# Patient Record
Sex: Female | Born: 1960 | Race: White | Hispanic: No | Marital: Single
Health system: Midwestern US, Community
[De-identification: ages and names within clinical notes are randomized; demographics above are authoritative.]

## PROBLEM LIST (undated history)

## (undated) DIAGNOSIS — R911 Solitary pulmonary nodule: Secondary | ICD-10-CM

## (undated) DIAGNOSIS — M1990 Unspecified osteoarthritis, unspecified site: Secondary | ICD-10-CM

## (undated) DIAGNOSIS — M419 Scoliosis, unspecified: Secondary | ICD-10-CM

## (undated) DIAGNOSIS — M75100 Unspecified rotator cuff tear or rupture of unspecified shoulder, not specified as traumatic: Secondary | ICD-10-CM

## (undated) DIAGNOSIS — G4700 Insomnia, unspecified: Secondary | ICD-10-CM

## (undated) DIAGNOSIS — J189 Pneumonia, unspecified organism: Secondary | ICD-10-CM

## (undated) DIAGNOSIS — IMO0002 Reserved for concepts with insufficient information to code with codable children: Secondary | ICD-10-CM

## (undated) DIAGNOSIS — L930 Discoid lupus erythematosus: Secondary | ICD-10-CM

## (undated) DIAGNOSIS — I8390 Asymptomatic varicose veins of unspecified lower extremity: Secondary | ICD-10-CM

## (undated) DIAGNOSIS — J45909 Unspecified asthma, uncomplicated: Secondary | ICD-10-CM

## (undated) DIAGNOSIS — IMO0001 Reserved for inherently not codable concepts without codable children: Secondary | ICD-10-CM

## (undated) DIAGNOSIS — M329 Systemic lupus erythematosus, unspecified: Secondary | ICD-10-CM

## (undated) DIAGNOSIS — M461 Sacroiliitis, not elsewhere classified: Secondary | ICD-10-CM

## (undated) DIAGNOSIS — F32A Depression, unspecified: Secondary | ICD-10-CM

## (undated) DIAGNOSIS — J9819 Other pulmonary collapse: Secondary | ICD-10-CM

## (undated) DIAGNOSIS — E538 Deficiency of other specified B group vitamins: Secondary | ICD-10-CM

## (undated) DIAGNOSIS — Z9289 Personal history of other medical treatment: Secondary | ICD-10-CM

## (undated) HISTORY — DX: Unspecified asthma, uncomplicated: J45.909

## (undated) HISTORY — DX: Reserved for concepts with insufficient information to code with codable children: IMO0002

## (undated) HISTORY — DX: Sacroiliitis, not elsewhere classified: M46.1

## (undated) HISTORY — DX: Depression, unspecified: F32.A

## (undated) HISTORY — DX: Insomnia, unspecified: G47.00

## (undated) HISTORY — DX: Systemic lupus erythematosus, unspecified: M32.9

## (undated) HISTORY — DX: Asymptomatic varicose veins of unspecified lower extremity: I83.90

## (undated) HISTORY — PX: CARDIAC CATHETERIZATION: SHX172

## (undated) HISTORY — DX: Deficiency of other specified B group vitamins: E53.8

## (undated) HISTORY — DX: Other pulmonary collapse: J98.19

## (undated) HISTORY — PX: ROTATOR CUFF REPAIR: SHX139

---
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
## 2009-12-25 LAB — ALLERGENS, SOUTHEAST COAST INHALANT (22)
Alternaria tenuis: <0.1 kU/L (ref <=0.34)
American Beech: <0.1 kU/L (ref <=0.34)
Aspergillus fumigatus: <0.1 kU/L (ref <=0.34)
Bermuda grass: <0.1 kU/L (ref <=0.34)
Cat epith./dander: <0.1 kU/L (ref <=0.34)
Common/Short ragweed: <0.1 kU/L (ref <=0.34)
Cottonwood: <0.1 kU/L (ref <=0.34)
D. farinae: <0.1 kU/L (ref <=0.34)
Dog dander: <0.1 kU/L (ref <=0.34)
English plantain: <0.1 kU/L (ref <=0.34)
German cockroach: <0.1 kU/L (ref <=0.34)
Giant ragweed: <0.1 kU/L (ref <=0.34)
Helminthosporium: <0.1 kU/L (ref <=0.34)
Hormodendrum: <0.1 kU/L (ref <=0.34)
House dust Stier: <0.1 kU/L (ref <=0.34)
Ky. Blue/June grass: <0.1 kU/L (ref <=0.34)
Lamb's Quarters: <0.1 kU/L (ref <=0.34)
Maple/Box Elder: <0.1 kU/L (ref <=0.34)
Oak: <0.1 kU/L (ref <=0.34)
Penicillium notatum: <0.1 kU/L (ref <=0.34)
Pigweed: <0.1 kU/L (ref <=0.34)
Timothy grass: <0.1 kU/L (ref <=0.34)

## 2009-12-25 LAB — IMMUNOGLOBULIN E, QT: Immunoglobulin E (IgE): 4 IU/mL (ref 0–180)

## 2010-02-04 LAB — HEMOGLOBIN: HGB: 14.8 g/dL (ref 11.7–15.4)

## 2012-03-30 ENCOUNTER — Encounter
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
## 2012-06-26 NOTE — Telephone Encounter (Signed)
Pt states that she has some shortness of breath and wheezing. She did a breathing tx this am, and did not improved much. Pt has a cough with some brown mucous. She denies fever, chills, does has body aches. Pt has some sick contacts. Pt requests to see Franky Macho, appt made 10:30 tomorrow with Franky Macho
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
## 2012-07-16 LAB — AMB POC SPIROMETRY
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
## 2012-07-31 NOTE — Telephone Encounter (Signed)
Patient called and left message on RX line requesting xopenex refill.Will E-Scribe.Landis Martins

## 2012-08-08 NOTE — Progress Notes (Signed)
Pt called and stated that she needed a PA for Xopenex HFA.  I have contacted [facility] in [place] (pt just moved there) [phone].  They stated that the PA had already been done and that it went through her [facility].  Her cost is $63.45.  I have attempted to inform the patient of this, but her voicemail is full and I cannot leave her a message.

## 2013-01-06 NOTE — ED Notes (Signed)
Pt here with nasal congestion and pain to bilateral ribs this morning. States history of bronchitis and collapsed lungs

## 2013-01-06 NOTE — ED Notes (Signed)
I have reviewed discharge instructions with the patient.  The patient verbalized understanding.      Pt given RX for following medications at discharge @MEDDISCHARGE@.  Pt discharged ambulatory from emergency department in no acute distress.
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
## 2013-01-07 NOTE — Telephone Encounter (Signed)
C/o semi-productive cough, some hemoptysis; chief concern is pain in lower ribs, went to ED during weekend b/c of hx of collapsed lung; cleared for effusions and collapsed lung and was advised to f/u here in office; continues w/ cough and rib pain, ED provider suggests pleurisy; appt made today w/ known provider NP Luc

## 2013-03-14 NOTE — Telephone Encounter (Signed)
Patient called and left message on voicemail stating that her insurance will not cover her Advair. She uses [facility] [phone]. I have called [facility] and spoke with pharmacy tech, she ran the patient's Advair and her copay is $3.60. I have called the patient and left her a message with this information.

## 2013-05-23 MED ORDER — LEVALBUTEROL HFA 45 MCG/ACTUATION AEROSOL INHALER
45 mcg/actuation | RESPIRATORY_TRACT | Status: AC
Start: 2013-05-23 — End: ?

## 2013-05-24 MED ORDER — BUDESONIDE-FORMOTEROL HFA 160 MCG-4.5 MCG/ACTUATION AEROSOL INHALER
Freq: Two times a day (BID) | RESPIRATORY_TRACT | Status: DC
Start: 2013-05-24 — End: 2013-06-06

## 2013-05-24 NOTE — Telephone Encounter (Signed)
Begin symbicort 160/4.5, 2 puffs bid, rinse mouth after use, # 1, refill x 11.  Please notify her of reason for change.  Thanks, Liberty MutualLuke

## 2013-05-24 NOTE — Telephone Encounter (Signed)
Received a denial for Advair from [employer] [facility], they are requiring that there patient have a trial and failure of Symbicort or Dulera first. Please advise

## 2013-05-24 NOTE — Telephone Encounter (Signed)
[facility] [place] Patient has been informed via message on voicemail.

## 2013-06-06 MED ORDER — MOMETASONE-FORMOTEROL HFA 200 MCG-5 MCG/ACTUATION AEROSOL INHALER
200-5 mcg/actuation | Freq: Two times a day (BID) | RESPIRATORY_TRACT | Status: DC
Start: 2013-06-06 — End: 2013-08-05

## 2013-06-06 NOTE — Telephone Encounter (Signed)
Dulera e-scribed to the patient's pharmacy.

## 2013-06-06 NOTE — Telephone Encounter (Signed)
Please stop symbicort and begin dulera 200/5, 2 puffs twice daily, rinse mouth after use, # 1, refill x 11.

## 2013-06-06 NOTE — Telephone Encounter (Signed)
The patient states she had been on Advair for a couple of years but her insurance wanted her to switch to Symbicort which she did a couple of weeks ago.  She states that Symbicort is not working nearly as well for her as the Advair did.  She says that Trula OreChristina had told her that if Symbicort didn't work she would need to try [place] for insurance purposes.      Franky MachoLuke, please advise if you would like to [facility]? Thank you

## 2013-06-06 NOTE — Telephone Encounter (Signed)
Tried to return the patient's call , could not reach her so left her a voicemail to return our call.

## 2013-07-24 MED ORDER — MONTELUKAST 10 MG TAB
10 mg | ORAL_TABLET | ORAL | Status: DC
Start: 2013-07-24 — End: 2013-07-24

## 2013-07-24 MED ORDER — MONTELUKAST 10 MG TAB
10 mg | ORAL_TABLET | ORAL | Status: AC
Start: 2013-07-24 — End: ?

## 2013-07-30 NOTE — Telephone Encounter (Signed)
The patient called and says that she woke up yesterday not feeling well and is blowing brown nasal drainage out of her nose, has mouth dryness, and is coughing a good bit.  She denies a fever, increased shortness of breath or wheezing.   She states that she used to do so well on Advair but says her insurance would not cover it so she had to switch to [facility] which she feels like does not work as well.  The patient states that  She does not know if this is allergy or lung related.  She reports taking Singulair every night and Dulera twice per day.  She says she hasn't had to use her Xopenex recently.   Offered her a work in appointment and she says she really doesn't want to come in unless we feel she needs to.  Told her it appears she was due for her 6 month return appointment anyway this month.  She accepted a work in appointment for tomorrow told 1:50 with Franky Macho.

## 2013-08-05 LAB — AMB POC SPIROMETRY

## 2013-08-05 MED ORDER — ALBUTEROL SULFATE 1.25 MG/3 ML (0.042 %) SOLN FOR INHALATION
1.25 mg/3 mL | RESPIRATORY_TRACT | Status: AC
Start: 2013-08-05 — End: ?

## 2013-08-05 MED ORDER — FLUTICASONE-SALMETEROL 500 MCG-50 MCG/DOSE DISK DEVICE FOR INHALATION
500-50 mcg/dose | RESPIRATORY_TRACT | Status: DC
Start: 2013-08-05 — End: 2013-09-04
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
## 2013-08-22 NOTE — Telephone Encounter (Signed)
Today is the 1st time anyone has mentioned an authorization for this patient. I have completed the form and the [facility] Adv[facility]. Waiting on a response.

## 2013-08-23 NOTE — Telephone Encounter (Signed)
Received fax from [facility], Garlon Hatchetdvair has been approved from 08/22/13-08/23/14.

## 2013-09-02 NOTE — Telephone Encounter (Signed)
Patient called the office, she said that she feels like the Advair 500-50 is too strong for her, she feels funny in her head. She would like to go back to the Advair 250-50. Please advise.

## 2013-09-03 NOTE — Telephone Encounter (Signed)
Unsure that this is related to advair, but ok to change to 250/50, 1 inhalation bid, rinse mouth after use.

## 2013-09-04 MED ORDER — FLUTICASONE-SALMETEROL 250 MCG-50 MCG/DOSE DISK DEVICE FOR INHALATION
250-50 mcg/dose | Freq: Two times a day (BID) | RESPIRATORY_TRACT | Status: DC
Start: 2013-09-04 — End: 2014-08-19

## 2013-09-04 NOTE — Telephone Encounter (Signed)
[facility]

## 2013-09-11 MED ORDER — PREDNISONE 20 MG TAB
20 mg | ORAL_TABLET | Freq: Every day | ORAL | Status: AC
Start: 2013-09-11 — End: 2013-09-16

## 2013-09-11 MED ORDER — DIPHENHYDRAMINE HCL 50 MG/ML IJ SOLN
50 mg/mL | INTRAMUSCULAR | Status: AC
Start: 2013-09-11 — End: 2013-09-11
  Administered 2013-09-11: 20:00:00 via INTRAVENOUS

## 2013-09-11 MED ORDER — METHYLPREDNISOLONE (PF) 125 MG/2 ML IJ SOLR
125 mg/2 mL | Freq: Once | INTRAMUSCULAR | Status: AC
Start: 2013-09-11 — End: 2013-09-11
  Administered 2013-09-11: 21:00:00 via INTRAVENOUS

## 2013-09-11 MED FILL — SOLU-MEDROL (PF) 125 MG/2 ML SOLUTION FOR INJECTION: 125 mg/2 mL | INTRAMUSCULAR | Qty: 2

## 2013-09-11 MED FILL — DIPHENHYDRAMINE HCL 50 MG/ML IJ SOLN: 50 mg/mL | INTRAMUSCULAR | Qty: 1

## 2013-09-11 NOTE — ED Notes (Signed)
I have reviewed discharge instructions with the patient.  The patient verbalized understanding. Prescriptions given times one.
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
## 2013-09-11 NOTE — ED Notes (Signed)
Pt states she was stung on left pointer finger approx 30 minutes PTA.  Pt took epi autoinjector to left thigh.

## 2013-09-12 LAB — EKG, 12 LEAD, INITIAL
Atrial Rate: 98 {beats}/min
Calculated P Axis: 43 degrees
Calculated R Axis: 13 degrees
Calculated T Axis: 72 degrees
P-R Interval: 128 ms
Q-T Interval: 360 ms
QRS Duration: 86 ms
QTC Calculation (Bezet): 459 ms
Ventricular Rate: 98 {beats}/min

## 2013-11-20 ENCOUNTER — Emergency Department (HOSPITAL_COMMUNITY)
Admission: EM | Admit: 2013-11-20 | Discharge: 2013-11-20 | Disposition: A | Discharge status: Home / Self Care | Payer: Medicare Other | Attending: Emergency Medicine | Admitting: Emergency Medicine

## 2013-11-20 ENCOUNTER — Encounter (HOSPITAL_COMMUNITY): Payer: Self-pay | Admitting: Emergency Medicine

## 2013-11-20 DIAGNOSIS — L930 Discoid lupus erythematosus: Secondary | ICD-10-CM | POA: Diagnosis present

## 2013-11-20 HISTORY — DX: Discoid lupus erythematosus: L93.0

## 2013-11-20 MED ORDER — TRIAMCINOLONE ACETONIDE 40 MG/ML IJ SUSP
5.0000 mg | Freq: Once | INTRAMUSCULAR | Status: DC
  Filled 2013-11-20: qty 0.5

## 2013-11-20 NOTE — Discharge Instructions (Signed)
Lupus Lupus (also called systemic lupus erythematosus, SLE) is a disorder of the body's natural defense system (immune system). In lupus, the immune system attacks various areas of the body (autoimmune disease). CAUSES The cause is unknown. However, lupus runs in families. Certain genes can make you more likely to develop lupus. It is 10 times more common in women than in men. Lupus is also more common in African Americans and Asians. Other factors also play a role, such as viruses (Epstein-Barr virus, EBV), stress, hormones, cigarette smoke, and certain drugs. SYMPTOMS Lupus can affect many parts of the body, including the joints, skin, kidneys, lungs, heart, nervous system, and blood vessels. The signs and symptoms of lupus differ from person to person. The disease can range from mild to life-threatening. Typical features of lupus include:  Butterfly-shaped rash over the face.  Arthritis involving one or more joints.  Kidney disease.  Fever, weight loss, hair loss, fatigue.  Poor circulation in the fingers and toes (Raynaud's disease).  Chest pain when taking deep breaths. Abdominal pain may also occur.  Skin rash in areas exposed to the sun.  Sores in the mouth and nose. DIAGNOSIS Diagnosing lupus can take a long time and is often difficult. An exam and an accurate account of your symptoms and health problems is very important. Blood tests are necessary, though no single test can confirm or rule out lupus. Most people with lupus test positive for antinuclear antibodies (ANA) on a blood test. Additional blood tests, a urine test (urinalysis), and sometimes a kidney or skin tissue sample (biopsy) can help to confirm or rule out lupus. TREATMENT There is no cure for lupus. Your caregiver will develop a treatment plan based on your age, sex, health, symptoms, and lifestyle. The goals are to prevent flares, to treat them when they do occur, and to minimize organ damage and complications. How  the disease may affect each person varies widely. Most people with lupus can live normal lives, but this disorder must be carefully monitored. Treatment must be adjusted as necessary to prevent serious complications. Medicines used for treatment:  Nonsteroidal anti-inflammatory drugs (NSAIDs) decrease inflammation and can help with chest pain, joint pain, and fevers. Examples include ibuprofen and naproxen.  Antimalarial drugs were designed to treat malaria. They also treat fatigue, joint pain, skin rashes, and inflammation of the lungs in patients with lupus.  Corticosteroids are powerful hormones that rapidly suppress inflammation. The lowest dose with the highest benefit will be chosen. They can be given by cream, pills, injections, and through the vein (intravenously).  Immunosuppressive drugs block the making of immune cells. They may be used for kidney or nerve disease. HOME CARE INSTRUCTIONS  Exercise. Low-impact activities can usually help keep joints flexible without being too strenuous.  Rest after periods of exercise.  Avoid excessive sun exposure.  Follow proper nutrition and take supplements as recommended by your caregiver.  Stress management can be helpful. SEEK MEDICAL CARE IF:  You have increased fatigue.  You develop pain.  You develop a rash.  You have an oral temperature above 102 F (38.9 C).  You develop abdominal discomfort.  You develop a headache.  You experience dizziness. FOR MORE INFORMATION National Institute of Neurological Disorders and Stroke: www.ninds.nih.gov American College of Rheumatology: www.rheumatology.org National Institute of Arthritis and Musculoskeletal and Skin Diseases: www.niams.nih.gov Document Released: 02/11/2002 Document Revised: 05/16/2011 Document Reviewed: 06/04/2009 ExitCare Patient Information 2015 [employer]. This information is not intended to replace advice given to you by your health   care provider. Make sure  you discuss any questions you have with your health care provider.  

## 2013-11-20 NOTE — ED Provider Notes (Signed)
CSN: [id number]     Arrival date & time 11/20/13  1252 History   First MD Initiated Contact with Patient 11/20/13 1319     Chief Complaint  Patient presents with  . Lupus     (Consider location/radiation/quality/duration/timing/severity/associated sxs/prior Treatment) HPI Comments: Patient presents to the ER for evaluation and treatment of discoid lupus. 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 Patient recently moved to the area, does not have a dermatologist. She reports that she has been on prednisone daily in the past which has prevented outbreaks, but she is currently taking it. She reports that when she gets to this point, and generally she needs an injection of Kenalog into the lesions to resolve. She reports multiple circular scab-like lesions on the scalp similar to previous outbreaks.   Past Medical History  Diagnosis Date  . Discoid lupus    Past Surgical History  Procedure Laterality Date  . Back surgery    . Spinal fusion     History reviewed. No pertinent family history. History  Substance Use Topics  . Smoking status: Never Smoker   . Smokeless tobacco: Not on file  . Alcohol Use: No   OB History   Grav Para Term Preterm Abortions TAB SAB Ect Mult Living                 Review of Systems  Skin: Positive for wound.  All other systems reviewed and are negative.     Allergies  Review of patient's allergies indicates no known allergies.  Home Medications   Prior to Admission medications   Not on File   BP 129/80  Pulse 78  Temp(Src) 98.1 F (36.7 C) (Oral)  Resp 18  SpO2 100%  LMP 11/06/2013 Physical Exam  Constitutional: She is oriented to person, place, and time. She appears well-developed and well-nourished. No distress.  HENT:  Head: Normocephalic and atraumatic.  Right Ear: Hearing normal.  Left Ear: Hearing normal.  Nose: Nose normal.  Mouth/Throat: Oropharynx is clear and moist and mucous  membranes are normal.  Eyes: Conjunctivae and EOM are normal. Pupils are equal, round, and reactive to light.  Neck: Normal range of motion. Neck supple.  Cardiovascular: Regular rhythm, S1 normal and S2 normal.  Exam reveals no gallop and no friction rub.   No murmur heard. Pulmonary/Chest: Effort normal and breath sounds normal. No respiratory distress. She exhibits no tenderness.  Abdominal: Soft. Normal appearance and bowel sounds are normal. There is no hepatosplenomegaly. There is no tenderness. There is no rebound, no guarding, no tenderness at McBurney's point and negative Murphy's sign. No hernia.  Musculoskeletal: Normal range of motion.  Neurological: She is alert and oriented to person, place, and time. She has normal strength. No cranial nerve deficit or sensory deficit. Coordination normal. GCS eye subscore is 4. GCS verbal subscore is 5. GCS motor subscore is 6.  Skin: Skin is warm, dry and intact. No rash noted. No cyanosis.     Psychiatric: She has a normal mood and affect. Her speech is normal and behavior is normal. Thought content normal.    ED Course  Procedures (including critical care time)  Intralesional injection with Kenalog: Area was cleaned with alcohol. A 27-gauge needle was inserted into the center of the lesion into the mid-dermis and 2 mg of Kenalog was injected. This was repeated one time for a total of 2 lesions. Patient tolerated procedure well, no complications.  Labs Review Labs  Reviewed - No data to display  Imaging Review No results found.   EKG Interpretation None      MDM   Final diagnoses:  None   discoid lupus  Patient presents to the ER for evaluation of discoid lupus outbreak. Patient has 2 discrete but small lesions on her posterior vertex area of scalp. This is the area where she generally has her outbreaks. Patient does not currently have a dermatologist, just recently moved to the area from [place]. She reports that  intralesional injections of Kenalog have been successful in the past. Patient was injected with Kenalog 2 mg intradermal injection in each of the lesions. Patient tolerated procedure well without complications.    Gilda Crease, MD 11/20/13 [phone]

## 2013-11-20 NOTE — ED Notes (Signed)
Pt just moved here and is not established w/ a PCP.  Pt states that she has an outbreak of discoid lupus on her scalp.  Requesting a scalp injection of kenalog.

## 2013-12-11 ENCOUNTER — Encounter: Attending: Acute Care | Primary: Student in an Organized Health Care Education/Training Program

## 2014-01-05 DIAGNOSIS — J189 Pneumonia, unspecified organism: Secondary | ICD-10-CM

## 2014-01-05 HISTORY — DX: Pneumonia, unspecified organism: J18.9

## 2014-03-05 ENCOUNTER — Emergency Department (HOSPITAL_COMMUNITY): Payer: Medicare Other

## 2014-03-05 ENCOUNTER — Emergency Department (HOSPITAL_COMMUNITY)
Admission: EM | Admit: 2014-03-05 | Discharge: 2014-03-05 | Disposition: A | Discharge status: Home / Self Care | Payer: Medicare Other | Attending: Emergency Medicine | Admitting: Emergency Medicine

## 2014-03-05 ENCOUNTER — Encounter (HOSPITAL_COMMUNITY): Payer: Self-pay | Admitting: Emergency Medicine

## 2014-03-05 DIAGNOSIS — Z79899 Other long term (current) drug therapy: Secondary | ICD-10-CM | POA: Diagnosis not present

## 2014-03-05 DIAGNOSIS — Z791 Long term (current) use of non-steroidal anti-inflammatories (NSAID): Secondary | ICD-10-CM | POA: Diagnosis not present

## 2014-03-05 DIAGNOSIS — Z8739 Personal history of other diseases of the musculoskeletal system and connective tissue: Secondary | ICD-10-CM | POA: Diagnosis not present

## 2014-03-05 DIAGNOSIS — R059 Cough, unspecified: Secondary | ICD-10-CM

## 2014-03-05 DIAGNOSIS — Z7951 Long term (current) use of inhaled steroids: Secondary | ICD-10-CM | POA: Diagnosis not present

## 2014-03-05 DIAGNOSIS — R5381 Other malaise: Secondary | ICD-10-CM | POA: Diagnosis not present

## 2014-03-05 DIAGNOSIS — Z8701 Personal history of pneumonia (recurrent): Secondary | ICD-10-CM | POA: Diagnosis not present

## 2014-03-05 DIAGNOSIS — Z872 Personal history of diseases of the skin and subcutaneous tissue: Secondary | ICD-10-CM | POA: Diagnosis not present

## 2014-03-05 DIAGNOSIS — R05 Cough: Secondary | ICD-10-CM

## 2014-03-05 HISTORY — DX: Pneumonia, unspecified organism: J18.9

## 2014-03-05 HISTORY — DX: Scoliosis, unspecified: M41.9

## 2014-03-05 NOTE — ED Provider Notes (Signed)
CSN: [id number]     Arrival date & time 03/05/14  [id number] History   First MD Initiated Contact with Patient 03/05/14 1004     Chief Complaint  Patient presents with  . Pneumonia      HPI Patient presents to the emergency department complaining of generalized malaise since recent illness when she was traveling out of state.  She states that time she was diagnosed with pneumonia and was placed on Levaquin.  She states she continues to not have her normal energy.  She denies melena or hematochezia.  No fevers or chills.  Appetite has been normal for her.  Denies abdominal pain.  She reports ongoing right sided chest discomfort which is been present for several years.  She states that is unchanged.  She always has productive cough reports no change in her phlegm production.  She is developing a relationship with a primary care physician reports that she previously always had a pulmonologist and would like referral to a pulmonologist.  X-ray which was brought with her from [place] reports that she had some scarring right changes noted in the right lower lung on x-ray.  She is curious about these.  She denies shortness of breath.   Past Medical History  Diagnosis Date  . Discoid lupus   . Pneumonia   . Scoliosis    Past Surgical History  Procedure Laterality Date  . Back surgery    . Spinal fusion     History reviewed. No pertinent family history. History  Substance Use Topics  . Smoking status: Never Smoker   . Smokeless tobacco: Not on file  . Alcohol Use: No   OB History    No data available     Review of Systems  All other systems reviewed and are negative.     Allergies  Review of patient's allergies indicates no known allergies.  Home Medications   Prior to Admission medications   Medication Sig Start Date End Date Taking? Authorizing Provider  albuterol (PROVENTIL) (2.5 MG/3ML) 0.083% nebulizer solution Take 2.5 mg by nebulization every 6 (six) hours as needed for wheezing  or shortness of breath.   Yes Historical Provider, MD  amitriptyline (ELAVIL) 10 MG tablet Take 20 mg by mouth at bedtime.   Yes Historical Provider, MD  Cyanocobalamin (VITAMIN B-12 IJ) Inject as directed every 30 (thirty) days.   Yes Historical Provider, MD  Fluticasone-Salmeterol (ADVAIR) 250-50 MCG/DOSE AEPB Inhale 1 puff into the lungs 2 (two) times daily.   Yes Historical Provider, MD  ipratropium (ATROVENT HFA) 17 MCG/ACT inhaler Inhale 2 puffs into the lungs every 4 (four) hours as needed for wheezing.   Yes Historical Provider, MD  meloxicam (MOBIC) 7.5 MG tablet Take 7.5 mg by mouth daily.   Yes Historical Provider, MD  montelukast (SINGULAIR) 10 MG tablet Take 10 mg by mouth at bedtime.   Yes Historical Provider, MD   BP 134/77 mmHg  Pulse 86  Temp(Src) 98.4 F (36.9 C) (Oral)  Resp 18  SpO2 99% Physical Exam  Constitutional: She is oriented to person, place, and time. She appears well-developed and well-nourished. No distress.  HENT:  Head: Normocephalic and atraumatic.  Eyes: Conjunctivae and EOM are normal.  Neck: Normal range of motion.  Cardiovascular: Normal rate, regular rhythm and normal heart sounds.   Pulmonary/Chest: Effort normal and breath sounds normal.  Abdominal: Soft. She exhibits no distension. There is no tenderness.  Musculoskeletal: Normal range of motion.  Neurological: She is alert and oriented to  person, place, and time.  Skin: Skin is warm and dry.  Psychiatric: She has a normal mood and affect. Judgment normal.  Nursing note and vitals reviewed.   ED Course  Procedures (including critical care time) Labs Review Labs Reviewed - No data to display  Imaging Review Dg Chest 2 View  03/05/2014   CLINICAL DATA:  Cough for 1 week.  EXAM: CHEST  2 VIEW  COMPARISON:  None.  FINDINGS: Postoperative changes from prior scoliosis surgery with posterior spinal rods in place. Linear densities in the right lateral lung base, likely scarring. Left lung is  clear. Heart is normal size. No effusions. No acute bony abnormality.  IMPRESSION: No active cardiopulmonary disease.   Electronically Signed   By: Charlett NoseKevin  Dover M.D.   On: 03/05/2014 10:24  I personally reviewed the imaging tests through PACS system I reviewed available ER/hospitalization records through the EMR    EKG Interpretation None      MDM   Final diagnoses:  Toma DeitersMalaise    X-ray today demonstrates chronic changes of her right lower lung.  These are unchanged as [facility]  Patient will likely need a CT through these at some point but it does not need to get done today.  Doubt pulmonary embolism.  No clinical signs to suggest pneumonia.  No shortness of breath.  Conjunctiva are normal and I do not think she needs blood work at this time.  She does states that blood work was done by her new primary care physician and she is waiting on the results.  I will defer much of this to the primary care physician.    Lyanne CoKevin M Tomas Schamp, MD 03/05/14 [phone]

## 2014-03-05 NOTE — ED Notes (Signed)
Pt states that she was seen in [place] and dx w/ pna a month ago (brought a cd of her xrays with her).  Pt states that she completed a course of levaquin but is "still not feeling very frisky".  States that she has a "partial collapse of rt lung from scoliosis surgery in 79".  Pt in NAD at this time.

## 2014-03-05 NOTE — ED Notes (Signed)
Patient transported to X-ray 

## 2014-04-01 ENCOUNTER — Encounter: Payer: Self-pay | Admitting: Pulmonary Disease

## 2014-04-01 ENCOUNTER — Ambulatory Visit (INDEPENDENT_AMBULATORY_CARE_PROVIDER_SITE_OTHER): Payer: Medicare Other | Admitting: Pulmonary Disease

## 2014-04-01 VITALS — BP 120/74 | HR 63 | Temp 97.0°F | Ht 63.0 in | Wt 145.2 lb

## 2014-04-01 DIAGNOSIS — R9389 Abnormal findings on diagnostic imaging of other specified body structures: Secondary | ICD-10-CM | POA: Insufficient documentation

## 2014-04-01 DIAGNOSIS — R938 Abnormal findings on diagnostic imaging of other specified body structures: Secondary | ICD-10-CM

## 2014-04-01 NOTE — Progress Notes (Signed)
   Subjective:    Patient ID: Jeanette Carpenter, female    DOB: 1960-08-12, 54 y.o.   MRN: [id number]  HPI The patient is a 54 year old female who I've been asked to see for an abnormal chest x-ray and right sided chest pain. The patient has a history of asthma which is under good control currently, but has noticed for the last 6 months a sharp pain on her right side primarily with heavy her exertional activities. Does not bother her at rest. She has no breathing issues at this time, and feels that it is at baseline. She has no history of thromboembolic disease or significant risk factors. The patient had a chest x-ray in [place] that I have reviewed, and shows a hazy density at the right base with pleural thickening. The radiologist to read the chest x-ray stated that it was not present in 2004. She has subsequently gone to the emergency room [place] in [place], or a chest x-ray showed some mild scarring at the right base with pleural thickening. The patient has a history of severe scoliosis, and has had rod placement and spinal fusion in 1979. She tells me that she required a right-sided chest tube at that time.   Review of Systems  Constitutional: Negative for fever and unexpected weight change.  HENT: Positive for congestion. Negative for dental problem, ear pain, nosebleeds, postnasal drip, rhinorrhea, sinus pressure, sneezing, sore throat and trouble swallowing.   Eyes: Negative for redness and itching.  Respiratory: Negative for cough, chest tightness, shortness of breath and wheezing.   Cardiovascular: Negative for palpitations and leg swelling.  Gastrointestinal: Negative for nausea and vomiting.  Genitourinary: Negative for dysuria.  Musculoskeletal: Negative for joint swelling.  Skin: Negative for rash.  Neurological: Negative for headaches.  Hematological: Does not bruise/bleed easily.  Psychiatric/Behavioral: Negative for dysphoric mood. The patient is not nervous/anxious.          Objective:   Physical Exam Constitutional:  Overweight female, no acute distress  HENT:  Nares patent without discharge  Oropharynx without exudate, palate and uvula are normal  Eyes:  Perrla, eomi, no scleral icterus  Neck:  No JVD, no TMG  Cardiovascular:  Normal rate, regular rhythm, no rubs or gallops.  No murmurs        Intact distal pulses  Pulmonary :  Normal breath sounds, no stridor or respiratory distress   No rales, rhonchi, or wheezing  Abdominal:  Soft, nondistended, bowel sounds present.  No tenderness noted.   Musculoskeletal:  No lower extremity edema noted.  Lymph Nodes:  No cervical lymphadenopathy noted  Skin:  No cyanosis noted  Neurologic:  Alert, appropriate, moves all 4 extremities without obvious deficit.         Assessment & Plan:

## 2014-04-01 NOTE — Patient Instructions (Signed)
Will schedule for CT chest to evaluate the bottom of the right lung. Will call to discuss results once available.

## 2014-04-01 NOTE — Assessment & Plan Note (Signed)
The patient is complaining of a sharp right-sided chest pain that has been present for 6 months, and is getting progressively worse. It does not bother her at rest, and it is primarily with heavy her exertional activities. Her chest x-ray has shown some changes in the right base, worse in November than December. She also has some pleural thickening that I suspect is related to her prior chest tube after scoliosis surgery. The patient does not have any significant cough, and no change in her breathing. I suspect the changes on the x-ray or simply chronic, and that her chest discomfort is probably musculoskeletal and related to her spine deformity. However, we'll check a scan of her chest to make sure that she does not have more of a pleural process which may be driving her discomfort. She has no history of or risk for thromboembolic disease, and her history today is not suggestive.

## 2014-04-02 ENCOUNTER — Ambulatory Visit (INDEPENDENT_AMBULATORY_CARE_PROVIDER_SITE_OTHER)
Admission: RE | Admit: 2014-04-02 | Discharge: 2014-04-02 | Disposition: A | Discharge status: Home / Self Care | Payer: Medicare Other | Source: Ambulatory Visit | Attending: Pulmonary Disease | Admitting: Pulmonary Disease

## 2014-04-02 DIAGNOSIS — R938 Abnormal findings on diagnostic imaging of other specified body structures: Secondary | ICD-10-CM

## 2014-04-02 DIAGNOSIS — R9389 Abnormal findings on diagnostic imaging of other specified body structures: Secondary | ICD-10-CM

## 2014-04-29 ENCOUNTER — Other Ambulatory Visit: Payer: Self-pay | Admitting: Surgical

## 2014-05-07 NOTE — H&P (Signed)
Jeanette Carpenter is an 54 y.o. female.   Chief Complaint: right shoulder pain HPI: The patient presented with the chief complaint of right shoulder pain x 2-3 months. No injury. The patient reports symptoms which include shoulder pain, tenderness, shoulder stiffness, decreased range of motion, inability to lay on that side, weakness and neck pain. The patient reports symptoms that radiate to the right upper arm. The patient describes these symptoms as moderate in severity and worsening. Symptoms are exacerbated by motion at the shoulder and lifting. Associated symptoms include weakness in the arm and pain in the arm. Current treatment includes nonsteroidal anti-inflammatory drugs (Meloxicam 7.5 mg qd). MRI showed right shoulder rotator cuff impingement and partial tear.  Past Medical History  Diagnosis Date  . Discoid lupus   . Scoliosis   . Lung collapse     right  . Asthma     Past Surgical History  Procedure Laterality Date  . Back surgery  1979    scoliosis spinal fusion  . Spinal fusion  1979    Family History  Problem Relation Age of Onset  . Emphysema Mother   . Asthma Mother   . Cancer Mother    Social History:  reports that she has never smoked. She reports that she drinks alcohol. She reports that she uses illicit drugs.  Allergies:  Allergies  Allergen Reactions  . Tramadol     itching    Current outpatient prescriptions:  .  albuterol (PROVENTIL) (2.5 MG/3ML) 0.083% nebulizer solution, Take 2.5 mg by nebulization every 6 (six) hours as needed for wheezing or shortness of breath., Disp: , Rfl:  .  amitriptyline (ELAVIL) 10 MG tablet, Take 20 mg by mouth at bedtime., Disp: , Rfl:  .  Cyanocobalamin (VITAMIN B-12 IJ), Inject as directed every 30 (thirty) days., Disp: , Rfl:  .  Fluticasone-Salmeterol (ADVAIR) 250-50 MCG/DOSE AEPB, Inhale 1 puff into the lungs 2 (two) times daily., Disp: , Rfl:  .  ipratropium (ATROVENT HFA) 17 MCG/ACT inhaler, Inhale 2 puffs into the  lungs every 4 (four) hours as needed for wheezing., Disp: , Rfl:  .  levalbuterol (XOPENEX HFA) 45 MCG/ACT inhaler, Inhale 2 puffs into the lungs every 6 (six) hours as needed for wheezing., Disp: , Rfl:  .  meloxicam (MOBIC) 7.5 MG tablet, Take 7.5 mg by mouth daily., Disp: , Rfl:  .  montelukast (SINGULAIR) 10 MG tablet, Take 10 mg by mouth at bedtime., Disp: , Rfl:   Review of Systems  Constitutional: Negative.   HENT: Negative.   Eyes: Negative.   Respiratory: Positive for shortness of breath. Negative for cough, hemoptysis, sputum production and wheezing.        SOB with exertion  Cardiovascular: Negative.   Gastrointestinal: Negative.   Genitourinary: Negative.   Musculoskeletal: Positive for back pain and joint pain. Negative for myalgias, falls and neck pain.       Right shoulder pain  Skin: Negative.   Neurological: Negative.   Endo/Heme/Allergies: Negative.   Psychiatric/Behavioral: Negative.    Vitals  Weight: 143 lb Height: 64in Body Surface Area: 1.7 m Body Mass Index: 24.55 kg/m  Pulse: 68 (Regular)  BP: 126/83 (Sitting, Left Arm, Standard)  Physical Exam  Constitutional: She is oriented to person, place, and time. She appears well-developed and well-nourished. No distress.  HENT:  Head: Normocephalic and atraumatic.  Right Ear: External ear normal.  Left Ear: External ear normal.  Nose: Nose normal.  Mouth/Throat: Oropharynx is clear and moist.  Eyes: Conjunctivae  and EOM are normal.  Neck: Normal range of motion. Neck supple.  Cardiovascular: Normal rate, regular rhythm, normal heart sounds and intact distal pulses.   No murmur heard. Respiratory: Effort normal and breath sounds normal.  GI: Soft. Bowel sounds are normal. She exhibits no distension. There is no tenderness.  Musculoskeletal:       Right shoulder: She exhibits decreased range of motion, tenderness, pain and decreased strength.       Left shoulder: Normal.       Right elbow:  Normal.      Left elbow: Normal.  Neurological: She is alert and oriented to person, place, and time. She has normal strength and normal reflexes. No sensory deficit.  Skin: No rash noted. She is not diaphoretic. No erythema.  Psychiatric: She has a normal mood and affect. Her behavior is normal.     Assessment/Plan Right shoulder, impingement and partial rotator cuff tear She needs a right shoulder open acromial decompression and possible repair of rotator cuff tear. We may or may not need to use a graft material that is made from calf skin.Also, we may need to use anchors. These are polyethylene anchors that stay in the bone and we use those anchors to suture the tendon down in certain cases where the tendon is completely pulled off the bone. There is always a chance of a secondary infection obviously with any surgery but we do use antibiotics preop.   H&P performed by Dr. Ranee Gosselin Documented by Dimitri Ped, PA-C  [place]amar[place] Gudiel Leotis Shames 05/07/2014, 1:46 PM

## 2014-05-13 NOTE — Patient Instructions (Signed)
Your procedure is scheduled on:05/21/14  WEDNESDAY  Report to [facility]- FOLLOW SIGNS TO SHORT STAY CENTER Short Stay Center at   [phone]    AM.   Call this number if you have problems the morning of surgery: [phone]      BRING YOUR INHALERS WITH YOU TO HOSPITAL  Do not eat food  Or drink :After Midnight. Tuesday NIGHT   Take these medicines the morning of surgery with A SIP OF WATER:NO Oral Meds USE  ADVAIR                                        MAY USE ATROVENT, ALBUTEROL, XOPENEX IF NEEDED   .  Contacts, dentures or partial plates, or metal hairpins  can not be worn to surgery. Your family will be responsible for glasses, dentures, hearing aides while you are in surgery  Leave suitcase in the car. After surgery it may be brought to your room.  For patients admitted to the hospital, checkout time is 11:00 AM day of  discharge.         [facility] IS NOT RESPONSIBLE FOR ANY VALUABLES  Patients discharged the day of surgery will not be allowed to drive home. IF going home the day of surgery, you must have a driver and someone to stay with you for the first 24 hours                                                                                                                                        [facility] - Preparing for Surgery Before surgery, you can play an important role.  Because skin is not sterile, your skin needs to be as free of germs as possible.  You can reduce the number of germs on your skin by washing with CHG (chlorahexidine gluconate) soap before surgery.  CHG is an antiseptic cleaner which kills germs and bonds with the skin to continue killing germs even after washing. Please DO NOT use if you have an allergy to CHG or antibacterial soaps.  If your skin becomes reddened/irritated stop using the CHG and inform your nurse when you arrive at Short Stay. Do not shave (including legs and underarms) for at least 48 hours prior to the first CHG  shower.  You may shave your face/neck. Please follow these instructions carefully:  1.  Shower with CHG Soap the night before surgery and the  morning of Surgery.  2.  If you choose to wash your hair, wash your hair first as usual with your  normal  shampoo.  3.  After you shampoo, rinse your hair and body thoroughly to remove the  shampoo.  4.  Use CHG as you would any other liquid soap.  You can apply chg directly  to the skin and wash                       Gently with a scrungie or clean washcloth.  5.  Apply the CHG Soap to your body ONLY FROM THE NECK DOWN.   Do not use on face/ open                           Wound or open sores. Avoid contact with eyes, ears mouth and genitals (private parts).                       Wash face,  Genitals (private parts) with your normal soap.             6.  Wash thoroughly, paying special attention to the area where your surgery  will be performed.  7.  Thoroughly rinse your body with warm water from the neck down.  8.  DO NOT shower/wash with your normal soap after using and rinsing off  the CHG Soap.                9.  Pat yourself dry with a clean towel.            10.  Wear clean pajamas.            11.  Place clean sheets on your bed the night of your first shower and do not  sleep with pets. Day of Surgery : Do not apply any lotions/deodorants the morning of surgery.  Please wear clean clothes to the hospital/surgery center.  FAILURE TO FOLLOW THESE INSTRUCTIONS MAY RESULT IN THE CANCELLATION OF YOUR SURGERY PATIENT SIGNATURE_________________________________  NURSE SIGNATURE__________________________________  ________________________________________________________________________   Rogelia Mire  An incentive spirometer is a tool that can help keep your lungs clear and active. This tool measures how well you are filling your lungs with each breath. Taking long deep breaths may help reverse or decrease the chance  of developing breathing (pulmonary) problems (especially infection) following:  A long period of time when you are unable to move or be active. BEFORE THE PROCEDURE   If the spirometer includes an indicator to show your best effort, your nurse or respiratory therapist will set it to a desired goal.  If possible, sit up straight or lean slightly forward. Try not to slouch.  Hold the incentive spirometer in an upright position. INSTRUCTIONS FOR USE  1. Sit on the edge of your bed if possible, or sit up as far as you can in bed or on a chair. 2. Hold the incentive spirometer in an upright position. 3. Breathe out normally. 4. Place the mouthpiece in your mouth and seal your lips tightly around it. 5. Breathe in slowly and as deeply as possible, raising the piston or the ball toward the top of the column. 6. Hold your breath for 3-5 seconds or for as long as possible. Allow the piston or ball to fall to the bottom of the column. 7. Remove the mouthpiece from your mouth and breathe out normally. 8. Rest for a few seconds and repeat Steps 1 through 7 at least 10 times every 1-2 hours when you are awake. Take your time and take a few normal breaths between deep breaths. 9. The spirometer may include an indicator to show  your best effort. Use the indicator as a goal to work toward during each repetition. 10. After each set of 10 deep breaths, practice coughing to be sure your lungs are clear. If you have an incision (the cut made at the time of surgery), support your incision when coughing by placing a pillow or rolled up towels firmly against it. Once you are able to get out of bed, walk around indoors and cough well. You may stop using the incentive spirometer when instructed by your caregiver.  RISKS AND COMPLICATIONS  Take your time so you do not get dizzy or light-headed.  If you are in pain, you may need to take or ask for pain medication before doing incentive spirometry. It is harder to  take a deep breath if you are having pain. AFTER USE  Rest and breathe slowly and easily.  It can be helpful to keep track of a log of your progress. Your caregiver can provide you with a simple table to help with this. If you are using the spirometer at home, follow these instructions: SEEK MEDICAL CARE IF:   You are having difficultly using the spirometer.  You have trouble using the spirometer as often as instructed.  Your pain medication is not giving enough relief while using the spirometer.  You develop fever of 100.5 F (38.1 C) or higher. SEEK IMMEDIATE MEDICAL CARE IF:   You cough up bloody sputum that had not been present before.  You develop fever of 102 F (38.9 C) or greater.  You develop worsening pain at or near the incision site. MAKE SURE YOU:   Understand these instructions.  Will watch your condition.  Will get help right away if you are not doing well or get worse. Document Released: 07/04/2006 Document Revised: 05/16/2011 Document Reviewed: 09/04/2006 [facility] Patient Information 2014 [place], [place].   ________________________________________________________________________

## 2014-05-13 NOTE — Progress Notes (Signed)
Cardiac cath report with ekg 2/13 chart Dr Weyman CroonVismara Ov dr Gibson Rampfeldman 5/13  Cardio chart Tee 3/13, tilt test 4/13 chart. Ov dr Shelle Ironlance 1/16 epic Chest ct 1/16 epic, chest 12/15 epic

## 2014-05-14 ENCOUNTER — Encounter (HOSPITAL_COMMUNITY)
Admission: RE | Admit: 2014-05-14 | Discharge: 2014-05-14 | Disposition: A | Discharge status: Home / Self Care | Payer: Medicare Other | Source: Ambulatory Visit | Attending: Orthopedic Surgery | Admitting: Orthopedic Surgery

## 2014-05-14 ENCOUNTER — Encounter (HOSPITAL_COMMUNITY): Payer: Self-pay

## 2014-05-14 DIAGNOSIS — Z01810 Encounter for preprocedural cardiovascular examination: Secondary | ICD-10-CM | POA: Insufficient documentation

## 2014-05-14 DIAGNOSIS — Z01812 Encounter for preprocedural laboratory examination: Secondary | ICD-10-CM | POA: Insufficient documentation

## 2014-05-14 DIAGNOSIS — M75101 Unspecified rotator cuff tear or rupture of right shoulder, not specified as traumatic: Secondary | ICD-10-CM | POA: Diagnosis not present

## 2014-05-14 HISTORY — DX: Unspecified osteoarthritis, unspecified site: M19.90

## 2014-05-14 HISTORY — DX: Personal history of other medical treatment: Z92.89

## 2014-05-14 HISTORY — DX: Unspecified rotator cuff tear or rupture of unspecified shoulder, not specified as traumatic: M75.100

## 2014-05-14 HISTORY — DX: Reserved for inherently not codable concepts without codable children: IMO0001

## 2014-05-14 LAB — CBC WITH DIFFERENTIAL/PLATELET
Basophils Absolute: 0 10*3/uL (ref 0.0–0.1)
Basophils Relative: 0 % (ref 0–1)
Eosinophils Absolute: 0.1 10*3/uL (ref 0.0–0.7)
Eosinophils Relative: 2 % (ref 0–5)
HCT: 43.4 % (ref 36.0–46.0)
Hemoglobin: 13.9 g/dL (ref 12.0–15.0)
Lymphocytes Relative: 30 % (ref 12–46)
Lymphs Abs: 1.6 10*3/uL (ref 0.7–4.0)
MCH: 28.8 pg (ref 26.0–34.0)
MCHC: 32 g/dL (ref 30.0–36.0)
MCV: 89.9 fL (ref 78.0–100.0)
Monocytes Absolute: 0.2 10*3/uL (ref 0.1–1.0)
Monocytes Relative: 5 % (ref 3–12)
Neutro Abs: 3.4 10*3/uL (ref 1.7–7.7)
Neutrophils Relative %: 63 % (ref 43–77)
Platelets: 206 10*3/uL (ref 150–400)
RBC: 4.83 MIL/uL (ref 3.87–5.11)
RDW: 13.5 % (ref 11.5–15.5)
WBC: 5.3 10*3/uL (ref 4.0–10.5)

## 2014-05-14 LAB — COMPREHENSIVE METABOLIC PANEL
ALT: 20 U/L (ref 0–35)
AST: 25 U/L (ref 0–37)
Albumin: 4.5 g/dL (ref 3.5–5.2)
Alkaline Phosphatase: 62 U/L (ref 39–117)
Anion gap: 8 (ref 5–15)
BUN: 12 mg/dL (ref 6–23)
CO2: 30 mmol/L (ref 19–32)
Calcium: 9.6 mg/dL (ref 8.4–10.5)
Chloride: 102 mmol/L (ref 96–112)
Creatinine, Ser: 0.83 mg/dL (ref 0.50–1.10)
GFR calc Af Amer: >90 mL/min (ref >90)
GFR calc non Af Amer: 80 mL/min — ABNORMAL LOW (ref >90)
Glucose, Bld: 104 mg/dL — ABNORMAL HIGH (ref 70–99)
Potassium: 4.2 mmol/L (ref 3.5–5.1)
Sodium: 140 mmol/L (ref 135–145)
Total Bilirubin: 0.6 mg/dL (ref 0.3–1.2)
Total Protein: 7.5 g/dL (ref 6.0–8.3)

## 2014-05-14 LAB — URINALYSIS, ROUTINE W REFLEX MICROSCOPIC
Bilirubin Urine: NEGATIVE
Glucose, UA: NEGATIVE mg/dL
Hgb urine dipstick: NEGATIVE
Ketones, ur: NEGATIVE mg/dL
Leukocytes, UA: NEGATIVE
Nitrite: NEGATIVE
Protein, ur: NEGATIVE mg/dL
Specific Gravity, Urine: 1.007 (ref 1.005–1.030)
Urobilinogen, UA: 0.2 mg/dL (ref 0.0–1.0)
pH: 5.5 (ref 5.0–8.0)

## 2014-05-14 LAB — HCG, SERUM, QUALITATIVE: PREG SERUM: NEGATIVE

## 2014-05-14 LAB — PROTIME-INR
INR: 0.97 (ref 0.00–1.49)
Prothrombin Time: 13 seconds (ref 11.6–15.2)

## 2014-05-14 LAB — APTT: aPTT: 31 seconds (ref 24–37)

## 2014-05-14 NOTE — Progress Notes (Signed)
lov dr clance, chest x ray report and ct reviewed by Dr Knox Royaltyenenney.  Patient denies any change in breathing or chest pain.  No new orders noted

## 2014-05-14 NOTE — Progress Notes (Signed)
ekg reviewed by dr Knox Royaltydenenney and 2013 previous tracing

## 2014-05-21 ENCOUNTER — Ambulatory Visit (HOSPITAL_COMMUNITY): Payer: Medicare Other | Admitting: Certified Registered"

## 2014-05-21 ENCOUNTER — Encounter (HOSPITAL_COMMUNITY): Payer: Self-pay | Admitting: *Deleted

## 2014-05-21 ENCOUNTER — Encounter (HOSPITAL_COMMUNITY)
Admission: RE | Disposition: A | Discharge status: Home / Self Care | Payer: Self-pay | Source: Ambulatory Visit | Attending: Orthopedic Surgery

## 2014-05-21 ENCOUNTER — Observation Stay (HOSPITAL_COMMUNITY)
Admission: RE | Admit: 2014-05-21 | Discharge: 2014-05-22 | Disposition: A | Discharge status: Home / Self Care | Payer: Medicare Other | Source: Ambulatory Visit | Attending: Orthopedic Surgery | Admitting: Orthopedic Surgery

## 2014-05-21 DIAGNOSIS — Z886 Allergy status to analgesic agent status: Secondary | ICD-10-CM | POA: Insufficient documentation

## 2014-05-21 DIAGNOSIS — M419 Scoliosis, unspecified: Secondary | ICD-10-CM | POA: Diagnosis not present

## 2014-05-21 DIAGNOSIS — M75100 Unspecified rotator cuff tear or rupture of unspecified shoulder, not specified as traumatic: Secondary | ICD-10-CM | POA: Diagnosis present

## 2014-05-21 DIAGNOSIS — L930 Discoid lupus erythematosus: Secondary | ICD-10-CM | POA: Insufficient documentation

## 2014-05-21 DIAGNOSIS — J45909 Unspecified asthma, uncomplicated: Secondary | ICD-10-CM | POA: Diagnosis not present

## 2014-05-21 DIAGNOSIS — M7541 Impingement syndrome of right shoulder: Secondary | ICD-10-CM | POA: Insufficient documentation

## 2014-05-21 DIAGNOSIS — M75111 Incomplete rotator cuff tear or rupture of right shoulder, not specified as traumatic: Principal | ICD-10-CM | POA: Insufficient documentation

## 2014-05-21 DIAGNOSIS — Z79899 Other long term (current) drug therapy: Secondary | ICD-10-CM | POA: Diagnosis not present

## 2014-05-21 DIAGNOSIS — M75101 Unspecified rotator cuff tear or rupture of right shoulder, not specified as traumatic: Secondary | ICD-10-CM | POA: Diagnosis present

## 2014-05-21 HISTORY — PX: SHOULDER OPEN ROTATOR CUFF REPAIR: SHX2407

## 2014-05-21 SURGERY — REPAIR, ROTATOR CUFF, OPEN
Anesthesia: General | Site: Shoulder | Laterality: Right

## 2014-05-21 MED ORDER — LACTATED RINGERS IV SOLN: INTRAVENOUS | Status: DC

## 2014-05-21 MED ORDER — PROPOFOL 10 MG/ML IV BOLUS
INTRAVENOUS | Status: DC | PRN
  Administered 2014-05-21: 150 mg via INTRAVENOUS

## 2014-05-21 MED ORDER — LIDOCAINE HCL (CARDIAC) 20 MG/ML IV SOLN
INTRAVENOUS | Status: AC
  Filled 2014-05-21: qty 5

## 2014-05-21 MED ORDER — HYDROMORPHONE HCL 1 MG/ML IJ SOLN
1.0000 mg | INTRAMUSCULAR | Status: DC | PRN
  Administered 2014-05-21 – 2014-05-22 (×2): 1 mg via INTRAVENOUS
  Filled 2014-05-21 (×2): qty 1

## 2014-05-21 MED ORDER — METHOCARBAMOL 500 MG PO TABS
500.0000 mg | ORAL_TABLET | Freq: Four times a day (QID) | ORAL | Status: DC | PRN
  Administered 2014-05-22: 500 mg via ORAL
  Filled 2014-05-21: qty 1

## 2014-05-21 MED ORDER — ONDANSETRON HCL 4 MG/2ML IJ SOLN: 4.0000 mg | Freq: Four times a day (QID) | INTRAMUSCULAR | Status: DC | PRN

## 2014-05-21 MED ORDER — PROPOFOL 10 MG/ML IV BOLUS
INTRAVENOUS | Status: AC
  Filled 2014-05-21: qty 20

## 2014-05-21 MED ORDER — FENTANYL CITRATE 0.05 MG/ML IJ SOLN
INTRAMUSCULAR | Status: AC
  Filled 2014-05-21: qty 2

## 2014-05-21 MED ORDER — PROMETHAZINE HCL 25 MG/ML IJ SOLN
6.2500 mg | Freq: Four times a day (QID) | INTRAMUSCULAR | Status: DC | PRN
  Administered 2014-05-21: 12.5 mg via INTRAVENOUS
  Filled 2014-05-21: qty 1

## 2014-05-21 MED ORDER — METHOCARBAMOL 500 MG PO TABS: 500.0000 mg | ORAL_TABLET | Freq: Four times a day (QID) | ORAL | Status: DC | PRN

## 2014-05-21 MED ORDER — ALBUTEROL SULFATE (2.5 MG/3ML) 0.083% IN NEBU: 2.5000 mg | INHALATION_SOLUTION | Freq: Four times a day (QID) | RESPIRATORY_TRACT | Status: DC | PRN

## 2014-05-21 MED ORDER — FLEET ENEMA 7-19 GM/118ML RE ENEM: 1.0000 | ENEMA | Freq: Once | RECTAL | Status: AC | PRN

## 2014-05-21 MED ORDER — HYDROMORPHONE HCL 1 MG/ML IJ SOLN
INTRAMUSCULAR | Status: AC
  Filled 2014-05-21: qty 1

## 2014-05-21 MED ORDER — PHENOL 1.4 % MT LIQD
1.0000 | OROMUCOSAL | Status: DC | PRN
  Filled 2014-05-21: qty 177

## 2014-05-21 MED ORDER — ONDANSETRON HCL 4 MG PO TABS: 4.0000 mg | ORAL_TABLET | Freq: Four times a day (QID) | ORAL | Status: DC | PRN

## 2014-05-21 MED ORDER — MONTELUKAST SODIUM 10 MG PO TABS
10.0000 mg | ORAL_TABLET | Freq: Every day | ORAL | Status: DC
  Administered 2014-05-21: 10 mg via ORAL
  Filled 2014-05-21 (×2): qty 1

## 2014-05-21 MED ORDER — SODIUM CHLORIDE 0.9 % IR SOLN
Status: DC | PRN
  Administered 2014-05-21: 500 mL

## 2014-05-21 MED ORDER — BUPIVACAINE LIPOSOME 1.3 % IJ SUSP
20.0000 mL | Freq: Once | INTRAMUSCULAR | Status: AC
  Administered 2014-05-21: 20 mL
  Filled 2014-05-21: qty 20

## 2014-05-21 MED ORDER — CEFAZOLIN SODIUM-DEXTROSE 2-3 GM-% IV SOLR
2.0000 g | INTRAVENOUS | Status: AC
  Administered 2014-05-21: 2 g via INTRAVENOUS

## 2014-05-21 MED ORDER — MIDAZOLAM HCL 2 MG/2ML IJ SOLN
INTRAMUSCULAR | Status: AC
Start: 2014-05-21 — End: 2014-05-21
  Filled 2014-05-21: qty 2

## 2014-05-21 MED ORDER — ACETAMINOPHEN 650 MG RE SUPP: 650.0000 mg | Freq: Four times a day (QID) | RECTAL | Status: DC | PRN

## 2014-05-21 MED ORDER — DEXAMETHASONE SODIUM PHOSPHATE 10 MG/ML IJ SOLN
INTRAMUSCULAR | Status: AC
  Filled 2014-05-21: qty 1

## 2014-05-21 MED ORDER — ALBUTEROL SULFATE (2.5 MG/3ML) 0.083% IN NEBU: 3.0000 mL | INHALATION_SOLUTION | Freq: Four times a day (QID) | RESPIRATORY_TRACT | Status: DC | PRN

## 2014-05-21 MED ORDER — CEFAZOLIN SODIUM-DEXTROSE 2-3 GM-% IV SOLR
INTRAVENOUS | Status: AC
  Filled 2014-05-21: qty 50

## 2014-05-21 MED ORDER — POLYETHYLENE GLYCOL 3350 17 G PO PACK: 17.0000 g | PACK | Freq: Every day | ORAL | Status: DC | PRN

## 2014-05-21 MED ORDER — MOMETASONE FURO-FORMOTEROL FUM 100-5 MCG/ACT IN AERO
2.0000 | INHALATION_SPRAY | Freq: Two times a day (BID) | RESPIRATORY_TRACT | Status: DC
  Filled 2014-05-21: qty 8.8

## 2014-05-21 MED ORDER — LACTATED RINGERS IV SOLN
INTRAVENOUS | Status: DC
  Administered 2014-05-21: 100 mL/h via INTRAVENOUS

## 2014-05-21 MED ORDER — ACETAMINOPHEN 325 MG PO TABS: 650.0000 mg | ORAL_TABLET | Freq: Four times a day (QID) | ORAL | Status: DC | PRN

## 2014-05-21 MED ORDER — METOCLOPRAMIDE HCL 5 MG/ML IJ SOLN
5.0000 mg | Freq: Three times a day (TID) | INTRAMUSCULAR | Status: DC | PRN
  Administered 2014-05-21: 10 mg via INTRAVENOUS
  Filled 2014-05-21: qty 2

## 2014-05-21 MED ORDER — IPRATROPIUM BROMIDE 0.02 % IN SOLN
2.5000 mL | RESPIRATORY_TRACT | Status: DC | PRN
Start: 2014-05-21 — End: 2014-05-22

## 2014-05-21 MED ORDER — HYDROCODONE-ACETAMINOPHEN 5-325 MG PO TABS: 1.0000 | ORAL_TABLET | ORAL | Status: DC | PRN

## 2014-05-21 MED ORDER — SUCCINYLCHOLINE CHLORIDE 20 MG/ML IJ SOLN
INTRAMUSCULAR | Status: DC | PRN
  Administered 2014-05-21: 80 mg via INTRAVENOUS

## 2014-05-21 MED ORDER — BISACODYL 5 MG PO TBEC: 5.0000 mg | DELAYED_RELEASE_TABLET | Freq: Every day | ORAL | Status: DC | PRN

## 2014-05-21 MED ORDER — ONDANSETRON HCL 4 MG/2ML IJ SOLN
INTRAMUSCULAR | Status: DC | PRN
  Administered 2014-05-21: 4 mg via INTRAVENOUS

## 2014-05-21 MED ORDER — DEXAMETHASONE SODIUM PHOSPHATE 10 MG/ML IJ SOLN
INTRAMUSCULAR | Status: DC | PRN
  Administered 2014-05-21: 10 mg via INTRAVENOUS

## 2014-05-21 MED ORDER — MIDAZOLAM HCL 5 MG/5ML IJ SOLN
INTRAMUSCULAR | Status: DC | PRN
  Administered 2014-05-21: 2 mg via INTRAVENOUS

## 2014-05-21 MED ORDER — THROMBIN 5000 UNITS EX SOLR
CUTANEOUS | Status: DC | PRN
  Administered 2014-05-21: 5000 [IU] via TOPICAL

## 2014-05-21 MED ORDER — KETOROLAC TROMETHAMINE 30 MG/ML IJ SOLN
INTRAMUSCULAR | Status: AC
  Filled 2014-05-21: qty 1

## 2014-05-21 MED ORDER — HYDROMORPHONE HCL 1 MG/ML IJ SOLN
0.2500 mg | INTRAMUSCULAR | Status: DC | PRN
  Administered 2014-05-21 (×4): 0.5 mg via INTRAVENOUS

## 2014-05-21 MED ORDER — FENTANYL CITRATE 0.05 MG/ML IJ SOLN
INTRAMUSCULAR | Status: DC | PRN
  Administered 2014-05-21 (×4): 50 ug via INTRAVENOUS

## 2014-05-21 MED ORDER — LACTATED RINGERS IV SOLN
INTRAVENOUS | Status: DC
  Administered 2014-05-21 (×2): 1000 mL via INTRAVENOUS

## 2014-05-21 MED ORDER — HYDROMORPHONE HCL 2 MG PO TABS
2.0000 mg | ORAL_TABLET | ORAL | Status: DC | PRN
  Administered 2014-05-22: 2 mg via ORAL
  Filled 2014-05-21 (×2): qty 1

## 2014-05-21 MED ORDER — AMITRIPTYLINE HCL 10 MG PO TABS
20.0000 mg | ORAL_TABLET | Freq: Every day | ORAL | Status: DC
  Administered 2014-05-21: 10 mg via ORAL
  Filled 2014-05-21 (×2): qty 2

## 2014-05-21 MED ORDER — SODIUM CHLORIDE 0.9 % IR SOLN
Status: AC
  Filled 2014-05-21: qty 1

## 2014-05-21 MED ORDER — ONDANSETRON HCL 4 MG/2ML IJ SOLN
INTRAMUSCULAR | Status: AC
  Filled 2014-05-21: qty 2

## 2014-05-21 MED ORDER — CEFAZOLIN SODIUM 1-5 GM-% IV SOLN
1.0000 g | Freq: Four times a day (QID) | INTRAVENOUS | Status: AC
  Administered 2014-05-21 – 2014-05-22 (×3): 1 g via INTRAVENOUS
  Filled 2014-05-21 (×3): qty 50

## 2014-05-21 MED ORDER — LIDOCAINE HCL (PF) 2 % IJ SOLN
INTRAMUSCULAR | Status: DC | PRN
  Administered 2014-05-21: 60 mg via INTRADERMAL

## 2014-05-21 MED ORDER — METHOCARBAMOL 1000 MG/10ML IJ SOLN
500.0000 mg | Freq: Four times a day (QID) | INTRAVENOUS | Status: DC | PRN
  Administered 2014-05-21: 500 mg via INTRAVENOUS
  Filled 2014-05-21 (×2): qty 5

## 2014-05-21 MED ORDER — HYDROMORPHONE HCL 2 MG PO TABS: 2.0000 mg | ORAL_TABLET | ORAL | Status: DC | PRN

## 2014-05-21 MED ORDER — MENTHOL 3 MG MT LOZG: 1.0000 | LOZENGE | OROMUCOSAL | Status: DC | PRN

## 2014-05-21 MED ORDER — KETOROLAC TROMETHAMINE 30 MG/ML IJ SOLN
INTRAMUSCULAR | Status: DC | PRN
  Administered 2014-05-21: 30 mg via INTRAVENOUS

## 2014-05-21 MED ORDER — METOCLOPRAMIDE HCL 10 MG PO TABS: 5.0000 mg | ORAL_TABLET | Freq: Three times a day (TID) | ORAL | Status: DC | PRN

## 2014-05-21 SURGICAL SUPPLY — 47 items
ATTRACTOMAT 16X20 MAGNETIC DRP (DRAPES) ×3 IMPLANT
BAG ZIPLOCK 12X15 (MISCELLANEOUS) IMPLANT
BLADE OSCILLATING/SAGITTAL (BLADE) ×2
BLADE SW THK.38XMED LNG THN (BLADE) ×1 IMPLANT
BNDG COHESIVE 6X5 TAN STRL LF (GAUZE/BANDAGES/DRESSINGS) ×3 IMPLANT
BUR OVAL CARBIDE 4.0 (BURR) ×3 IMPLANT
DERMABOND ADVANCED (GAUZE/BANDAGES/DRESSINGS) ×2
DERMABOND ADVANCED .7 DNX12 (GAUZE/BANDAGES/DRESSINGS) ×1 IMPLANT
DERMASPAN .5-.9MM 4X4CM SHOU (Miscellaneous) ×3 IMPLANT
DRAPE POUCH INSTRU U-SHP 10X18 (DRAPES) ×3 IMPLANT
DRSG AQUACEL AG ADV 3.5X 6 (GAUZE/BANDAGES/DRESSINGS) ×3 IMPLANT
DURAPREP 26ML APPLICATOR (WOUND CARE) ×3 IMPLANT
ELECT BLADE TIP CTD 4 INCH (ELECTRODE) IMPLANT
ELECT REM PT RETURN 9FT ADLT (ELECTROSURGICAL) ×3
ELECTRODE REM PT RTRN 9FT ADLT (ELECTROSURGICAL) ×1 IMPLANT
GLOVE BIOGEL PI IND STRL 6.5 (GLOVE) ×1 IMPLANT
GLOVE BIOGEL PI IND STRL 8 (GLOVE) ×1 IMPLANT
GLOVE BIOGEL PI INDICATOR 6.5 (GLOVE) ×2
GLOVE BIOGEL PI INDICATOR 8 (GLOVE) ×2
GLOVE ECLIPSE 8.0 STRL XLNG CF (GLOVE) ×3 IMPLANT
GLOVE SURG SS PI 6.5 STRL IVOR (GLOVE) ×3 IMPLANT
GOWN STRL REUS W/TWL LRG LVL3 (GOWN DISPOSABLE) ×3 IMPLANT
GOWN STRL REUS W/TWL XL LVL3 (GOWN DISPOSABLE) ×3 IMPLANT
KIT BASIN OR (CUSTOM PROCEDURE TRAY) ×3 IMPLANT
KIT POSITION SHOULDER SCHLEI (MISCELLANEOUS) ×3 IMPLANT
LIQUID BAND (GAUZE/BANDAGES/DRESSINGS) ×3 IMPLANT
MANIFOLD NEPTUNE II (INSTRUMENTS) ×3 IMPLANT
NEEDLE MA TROC 1/2 (NEEDLE) IMPLANT
NS IRRIG 1000ML POUR BTL (IV SOLUTION) IMPLANT
PACK SHOULDER (CUSTOM PROCEDURE TRAY) ×3 IMPLANT
POSITIONER SURGICAL ARM (MISCELLANEOUS) ×3 IMPLANT
SLING ARM IMMOBILIZER LRG (SOFTGOODS) ×3 IMPLANT
SLING ARM IMMOBILIZER MED (SOFTGOODS) ×3 IMPLANT
SPONGE LAP 4X18 X RAY DECT (DISPOSABLE) IMPLANT
SPONGE SURGIFOAM ABS GEL 100 (HEMOSTASIS) ×3 IMPLANT
STAPLER VISISTAT 35W (STAPLE) IMPLANT
SUCTION FRAZIER 12FR DISP (SUCTIONS) ×3 IMPLANT
SUT BONE WAX W31G (SUTURE) ×3 IMPLANT
SUT ETHIBOND NAB CT1 #1 30IN (SUTURE) ×3 IMPLANT
SUT MNCRL AB 4-0 PS2 18 (SUTURE) ×3 IMPLANT
SUT VIC AB 0 CT1 27 (SUTURE)
SUT VIC AB 0 CT1 27XBRD ANTBC (SUTURE) IMPLANT
SUT VIC AB 1 CT1 27 (SUTURE) ×4
SUT VIC AB 1 CT1 27XBRD ANTBC (SUTURE) ×2 IMPLANT
SUT VIC AB 2-0 CT1 27 (SUTURE) ×4
SUT VIC AB 2-0 CT1 TAPERPNT 27 (SUTURE) ×2 IMPLANT
TOWEL OR 17X26 10 PK STRL BLUE (TOWEL DISPOSABLE) ×3 IMPLANT

## 2014-05-21 NOTE — Anesthesia Preprocedure Evaluation (Signed)
Anesthesia Evaluation  Patient identified by MRN, date of birth, ID band Patient awake    Reviewed: Allergy & Precautions, H&P , NPO status , Patient's Chart, lab work & pertinent test results  Airway Mallampati: II  TM Distance: >3 FB Neck ROM: full    Dental no notable dental hx. (+) Teeth Intact, Dental Advisory Given   Pulmonary shortness of breath and with exertion, asthma ,  Partial collapse lung on right breath sounds clear to auscultation  Pulmonary exam normal       Cardiovascular Exercise Tolerance: Good negative cardio ROS  Rhythm:regular Rate:Normal     Neuro/Psych Scoliosis. Spinal fusion negative neurological ROS  negative psych ROS   GI/Hepatic negative GI ROS, Neg liver ROS,   Endo/Other  negative endocrine ROS  Renal/GU negative Renal ROS  negative genitourinary   Musculoskeletal   Abdominal   Peds  Hematology negative hematology ROS (+)   Anesthesia Other Findings Discoid lupus  Reproductive/Obstetrics negative OB ROS                             Anesthesia Physical Anesthesia Plan  ASA: III  Anesthesia Plan: General   Post-op Pain Management:    Induction: Intravenous  Airway Management Planned: Oral ETT  Additional Equipment:   Intra-op Plan:   Post-operative Plan: Extubation in OR  Informed Consent: I have reviewed the patients History and Physical, chart, labs and discussed the procedure including the risks, benefits and alternatives for the proposed anesthesia with the patient or authorized representative who has indicated his/her understanding and acceptance.   Dental Advisory Given  Plan Discussed with: CRNA and Surgeon  Anesthesia Plan Comments:         Anesthesia Quick Evaluation

## 2014-05-21 NOTE — Discharge Instructions (Signed)
Keep your sling on at all times, including sleeping in your sling. The only time you should remove your sling is to shower only but you need to keep your hand against your chest while you shower.  You may shower with dressing on. Do not remove dressing unless it appears compromised. Shower only, no tub bath. Call if any temperatures greater than 101 or any wound complications: [phone] during the day and ask for Dr. Jeannetta EllisGioffre's nurse, Mackey Birchwoodammy Johnson.

## 2014-05-21 NOTE — Anesthesia Procedure Notes (Signed)
Procedure Name: Intubation Date/Time: 05/21/2014 10:28 AM Performed by: Early OsmondEARGLE, Lella Mullany E Pre-anesthesia Checklist: Patient identified, Emergency Drugs available, Suction available and Patient being monitored Patient Re-evaluated:Patient Re-evaluated prior to inductionOxygen Delivery Method: Circle system utilized Preoxygenation: Pre-oxygenation with 100% oxygen Intubation Type: IV induction Ventilation: Mask ventilation without difficulty Laryngoscope Size: Miller and 3 Grade View: Grade I Tube type: Oral Tube size: 6.5 mm Number of attempts: 1 Placement Confirmation: ETT inserted through vocal cords under direct vision,  positive ETCO2,  CO2 detector and breath sounds checked- equal and bilateral Secured at: 21 cm Tube secured with: Tape Dental Injury: Teeth and Oropharynx as per pre-operative assessment

## 2014-05-21 NOTE — Interval H&P Note (Signed)
History and Physical Interval Note:  05/21/2014 9:28 AM  Jeanette Carpenter  has presented today for surgery, with the diagnosis of TORN RIGHT ROTATOR CUFF  The various methods of treatment have been discussed with the patient and family. After consideration of risks, benefits and other options for treatment, the patient has consented to  Procedure(s): RIGHT SHOULDER ACROIMECTOMY AND POSSIBLE GRAFT WITH RIGHT ROTATOR CUFF REPAIR  (Right) as a surgical intervention .  The patient's history has been reviewed, patient examined, no change in status, stable for surgery.  I have reviewed the patient's chart and labs.  Questions were answered to the patient's satisfaction.     [name]

## 2014-05-21 NOTE — Anesthesia Postprocedure Evaluation (Signed)
  Anesthesia Post-op Note  Patient: Jeanette Carpenter  Procedure(s) Performed: Procedure(s) (LRB): RIGHT SHOULDER ACROIMECTOMY , GRAFT WITH RIGHT ROTATOR CUFF REPAIR  (Right)  Patient Location: PACU  Anesthesia Type: General  Level of Consciousness: awake and alert   Airway and Oxygen Therapy: Patient Spontanous Breathing  Post-op Pain: mild  Post-op Assessment: Post-op Vital signs reviewed, Patient's Cardiovascular Status Stable, Respiratory Function Stable, Patent Airway and No signs of Nausea or vomiting  Last Vitals:  Filed Vitals:   05/21/14 1318  BP: 127/76  Pulse: 84  Temp: 36.5 C  Resp: 14    Post-op Vital Signs: stable   Complications: No apparent anesthesia complications

## 2014-05-21 NOTE — Transfer of Care (Signed)
Immediate Anesthesia Transfer of Care Note  Patient: Jeanette Carpenter  Procedure(s) Performed: Procedure(s) (LRB): RIGHT SHOULDER ACROIMECTOMY , GRAFT WITH RIGHT ROTATOR CUFF REPAIR  (Right)  Patient Location: PACU  Anesthesia Type: General  Level of Consciousness: sedated, patient cooperative and responds to stimulation  Airway & Oxygen Therapy: Patient Spontanous Breathing and Patient connected to face mask oxgen  Post-op Assessment: Report given to PACU RN and Post -op Vital signs reviewed and stable  Post vital signs: Reviewed and stable  Complications: No apparent anesthesia complications

## 2014-05-21 NOTE — Brief Op Note (Signed)
05/21/2014  11:29 AM  PATIENT:  Jeanette Carpenter  54 y.o. female  PRE-OPERATIVE DIAGNOSIS:  TORN RIGHT ROTATOR CUFF and Severe Impingement.  POST-OPERATIVE DIAGNOSIS:  TORN RIGHT ROTATOR CUFF and Severe Impingement.  PROCEDURE:  Procedure(s): RIGHT SHOULDER ACROIMECTOMY , GRAFT WITH RIGHT ROTATOR CUFF REPAIR  (Right)  SURGEON:  Surgeon(s) and Role:    * Ranee Gosselinonald Merit Gadsby, MD - Primary  PHYSICIAN ASSISTANT: Dimitri PedAmber Constable PA  ASSISTANTS: Dimitri PedAmber Constable PA  ANESTHESIA:   general  EBL:     BLOOD ADMINISTERED:none  DRAINS: none   LOCAL MEDICATIONS USED:  BUPIVICAINE 20cc of Exparel  SPECIMEN:  No Specimen  DISPOSITION OF SPECIMEN:  N/A  COUNTS:  YES  TOURNIQUET:  * No tourniquets in log *  DICTATION: .Other Dictation: Dictation Number [id number]  PLAN OF CARE: Admit for overnight observation  PATIENT DISPOSITION:  Stable in OR   Delay start of Pharmacological VTE agent (>24hrs) due to surgical blood loss or risk of bleeding: yes

## 2014-05-21 NOTE — Interval H&P Note (Signed)
History and Physical Interval Note:  05/21/2014 9:29 AM  Jeanette Carpenter  has presented today for surgery, with the diagnosis of TORN RIGHT ROTATOR CUFF  The various methods of treatment have been discussed with the patient and family. After consideration of risks, benefits and other options for treatment, the patient has consented to  Procedure(s): RIGHT SHOULDER ACROIMECTOMY AND POSSIBLE GRAFT WITH RIGHT ROTATOR CUFF REPAIR  (Right) as a surgical intervention .  The patient's history has been reviewed, patient examined, no change in status, stable for surgery.  I have reviewed the patient's chart and labs.  Questions were answered to the patient's satisfaction.     [name]

## 2014-05-22 ENCOUNTER — Emergency Department (HOSPITAL_COMMUNITY): Payer: Medicare Other

## 2014-05-22 ENCOUNTER — Encounter (HOSPITAL_COMMUNITY): Payer: Self-pay | Admitting: Orthopedic Surgery

## 2014-05-22 ENCOUNTER — Emergency Department (HOSPITAL_COMMUNITY)
Admission: EM | Admit: 2014-05-22 | Discharge: 2014-05-22 | Disposition: A | Discharge status: Home / Self Care | Payer: Medicare Other | Attending: Emergency Medicine | Admitting: Emergency Medicine

## 2014-05-22 DIAGNOSIS — Z872 Personal history of diseases of the skin and subcutaneous tissue: Secondary | ICD-10-CM | POA: Diagnosis not present

## 2014-05-22 DIAGNOSIS — M25511 Pain in right shoulder: Secondary | ICD-10-CM | POA: Diagnosis not present

## 2014-05-22 DIAGNOSIS — R21 Rash and other nonspecific skin eruption: Secondary | ICD-10-CM | POA: Diagnosis not present

## 2014-05-22 DIAGNOSIS — Z791 Long term (current) use of non-steroidal anti-inflammatories (NSAID): Secondary | ICD-10-CM | POA: Diagnosis not present

## 2014-05-22 DIAGNOSIS — M1990 Unspecified osteoarthritis, unspecified site: Secondary | ICD-10-CM | POA: Insufficient documentation

## 2014-05-22 DIAGNOSIS — J45909 Unspecified asthma, uncomplicated: Secondary | ICD-10-CM | POA: Insufficient documentation

## 2014-05-22 DIAGNOSIS — R6883 Chills (without fever): Secondary | ICD-10-CM

## 2014-05-22 DIAGNOSIS — Z7951 Long term (current) use of inhaled steroids: Secondary | ICD-10-CM | POA: Diagnosis not present

## 2014-05-22 DIAGNOSIS — Z8701 Personal history of pneumonia (recurrent): Secondary | ICD-10-CM | POA: Diagnosis not present

## 2014-05-22 DIAGNOSIS — M75111 Incomplete rotator cuff tear or rupture of right shoulder, not specified as traumatic: Secondary | ICD-10-CM | POA: Diagnosis not present

## 2014-05-22 DIAGNOSIS — R509 Fever, unspecified: Secondary | ICD-10-CM | POA: Diagnosis present

## 2014-05-22 LAB — CBC
HCT: 41 % (ref 36.0–46.0)
Hemoglobin: 13.1 g/dL (ref 12.0–15.0)
MCH: 28.9 pg (ref 26.0–34.0)
MCHC: 32 g/dL (ref 30.0–36.0)
MCV: 90.5 fL (ref 78.0–100.0)
Platelets: 222 10*3/uL (ref 150–400)
RBC: 4.53 MIL/uL (ref 3.87–5.11)
RDW: 13.7 % (ref 11.5–15.5)
WBC: 11.1 10*3/uL — AB (ref 4.0–10.5)

## 2014-05-22 LAB — BASIC METABOLIC PANEL
ANION GAP: 7 (ref 5–15)
BUN: 13 mg/dL (ref 6–23)
CHLORIDE: 104 mmol/L (ref 96–112)
CO2: 28 mmol/L (ref 19–32)
Calcium: 9.1 mg/dL (ref 8.4–10.5)
Creatinine, Ser: 0.82 mg/dL (ref 0.50–1.10)
GFR, EST NON AFRICAN AMERICAN: 81 mL/min — AB (ref >90)
Glucose, Bld: 113 mg/dL — ABNORMAL HIGH (ref 70–99)
Potassium: 3.9 mmol/L (ref 3.5–5.1)
SODIUM: 139 mmol/L (ref 135–145)

## 2014-05-22 MED ORDER — DIPHENHYDRAMINE HCL 50 MG/ML IJ SOLN
25.0000 mg | Freq: Once | INTRAMUSCULAR | Status: AC
  Administered 2014-05-22: 25 mg via INTRAVENOUS
  Filled 2014-05-22: qty 1

## 2014-05-22 MED ORDER — ALPRAZOLAM 0.5 MG PO TABS: 0.5000 mg | ORAL_TABLET | Freq: Three times a day (TID) | ORAL | Status: DC | PRN

## 2014-05-22 MED ORDER — HYDROMORPHONE HCL 1 MG/ML IJ SOLN
1.0000 mg | Freq: Once | INTRAMUSCULAR | Status: AC
  Administered 2014-05-22: 1 mg via INTRAVENOUS
  Filled 2014-05-22: qty 1

## 2014-05-22 MED ORDER — OXYCODONE-ACETAMINOPHEN 5-325 MG PO TABS: 1.0000 | ORAL_TABLET | Freq: Four times a day (QID) | ORAL | Status: DC | PRN

## 2014-05-22 MED ORDER — FENTANYL CITRATE 0.05 MG/ML IJ SOLN
50.0000 ug | Freq: Once | INTRAMUSCULAR | Status: AC
  Administered 2014-05-22: 50 ug via INTRAVENOUS
  Filled 2014-05-22: qty 2

## 2014-05-22 NOTE — Progress Notes (Signed)
   05/22/14 1100  OT Visit Information  Last OT Received On 05/22/14  Assistance Needed +1  History of Present Illness s/p R RCR with graft  OT Time Calculation  OT Start Time (ACUTE ONLY) 1043  OT Stop Time (ACUTE ONLY) 1104  OT Time Calculation (min) 21 min  Precautions  Precautions Shoulder  Type of Shoulder Precautions conservative protocol:  sling at all times, no pendulums, AROM elbow through fingers as tolerated  Pain Assessment  Pain Score 10  Pain Location R shoulder  Pain Descriptors / Indicators Aching  Pain Intervention(s) Limited activity within patient's tolerance;Monitored during session;Premedicated before session;Repositioned;Patient requesting pain meds-RN notified;Ice applied  Cognition  Arousal/Alertness Awake/alert  Behavior During Therapy WFL for tasks assessed/performed  Overall Cognitive Status Within Functional Limits for tasks assessed  ADL  General ADL Comments Pt's roommate arrived.  therapist donned and doffed sling to show roommate, supporting forearm due to pain. He did not practice as pt was in so much pain.  Explained sequence for shirt and assisting with washing back side of axilla using seesawing method.  Sling was exchanged for medium, which gives better support.  Pt does not like thumb loop.  Roommate verbalized understanding of all education.  Reviewed ice on and off (20-30 minutes on).    Restrictions  Weight Bearing Restrictions Yes  Other Position/Activity Restrictions NWB RUE  OT - End of Session  Activity Tolerance Patient limited by pain  Patient left in chair;with call bell/phone within reach;with family/visitor present  OT Assessment/Plan  Follow Up Recommendations Supervision/Assistance - 24 hour  OT Equipment None recommended by OT  OT Goal Progression  Progress towards OT goals Goals met/education completed, patient discharged from OT (caregiver verbalizes understanding)  OT G-codes **NOT FOR INPATIENT CLASS**  Self Care Discharge  Status (C4818) CK  OT General Charges  $OT Visit 1 Procedure  OT Treatments  $Self Care/Home Management  8-22 mins  Lesle Chris, OTR/L [phone] 05/22/2014

## 2014-05-22 NOTE — ED Notes (Signed)
Bed: WA02 Expected date:  Expected time:  Means of arrival:  Comments: Hold per charge 

## 2014-05-22 NOTE — ED Provider Notes (Signed)
CSN: [id number]     Arrival date & time 05/22/14  1734 History   First MD Initiated Contact with Patient 05/22/14 1809     Chief Complaint  Patient presents with  . Post-op Problem     (Consider location/radiation/quality/duration/timing/severity/associated sxs/prior Treatment) Patient is a 54 y.o. female presenting with general illness. The history is provided by the patient.  Illness Location:  Generalized Quality:  Chills Severity:  Moderate Onset quality:  Sudden Duration:  1 hour Timing:  Constant Progression:  Unchanged Chronicity:  New Context:  Just discharged, post-op from R rotator cuff surgery Relieved by:  Nothing Worsened by:  Nothing Associated symptoms: rash (redness in her chest)   Associated symptoms: no abdominal pain, no congestion, no cough, no fever, no shortness of breath and no vomiting   Associated symptoms comment:  Chills   Past Medical History  Diagnosis Date  . Discoid lupus   . Scoliosis   . Lung collapse     right  . Asthma   . Rotator cuff tear     right  . Shortness of breath dyspnea     partial collapse lung on right- nothing new at this time  . Pneumonia 11/15  . Arthritis   . History of blood transfusion    Past Surgical History  Procedure Laterality Date  . Back surgery  1979    scoliosis spinal fusion  . Spinal fusion  1979    2 rods in back, upper right, lower left  . Cardiac catheterization    . Shoulder open rotator cuff repair Right 05/21/2014    Procedure: RIGHT SHOULDER ACROIMECTOMY , GRAFT WITH RIGHT ROTATOR CUFF REPAIR ;  Surgeon: Ranee Gosselinonald Gioffre, MD;  Location: [facility];  Service: Orthopedics;  Laterality: Right;   Family History  Problem Relation Age of Onset  . Emphysema Mother   . Asthma Mother   . Cancer Mother    History  Substance Use Topics  . Smoking status: Never Smoker   . Smokeless tobacco: Never Used  . Alcohol Use: 0.0 oz/week    0 Standard drinks or equivalent per week     Comment: rarely   OB  History    No data available     Review of Systems  Constitutional: Negative for fever.  HENT: Negative for congestion.   Respiratory: Negative for cough and shortness of breath.   Gastrointestinal: Negative for vomiting and abdominal pain.  Skin: Positive for rash (redness in her chest).  All other systems reviewed and are negative.     Allergies  Lyrica and Tramadol  Home Medications   Prior to Admission medications   Medication Sig Start Date End Date Taking? Authorizing Provider  albuterol (PROVENTIL) (2.5 MG/3ML) 0.083% nebulizer solution Take 2.5 mg by nebulization every 6 (six) hours as needed for wheezing or shortness of breath.   Yes Historical Provider, MD  amitriptyline (ELAVIL) 10 MG tablet Take 20 mg by mouth at bedtime.   Yes Historical Provider, MD  Cyanocobalamin (VITAMIN B-12 IJ) Inject as directed every 30 (thirty) days.   Yes Historical Provider, MD  Fluticasone-Salmeterol (ADVAIR) 250-50 MCG/DOSE AEPB Inhale 1 puff into the lungs 2 (two) times daily.   Yes Historical Provider, MD  HYDROmorphone (DILAUDID) 2 MG tablet Take 1 tablet (2 mg total) by mouth every 3 (three) hours as needed for severe pain (Q4-6 hours PRN). 05/21/14  Yes Amber Celedonio Savageonstable, PA-C  ipratropium (ATROVENT HFA) 17 MCG/ACT inhaler Inhale 2 puffs into the lungs every 4 (four) hours  as needed for wheezing.   Yes Historical Provider, MD  levalbuterol [facility] HFA) 45 MCG/ACT inhaler Inhale 2 puffs into the lungs every 6 (six) hours as needed for wheezing.   Yes Historical Provider, MD  methocarbamol (ROBAXIN) 500 MG tablet Take 1 tablet (500 mg total) by mouth every 6 (six) hours as needed for muscle spasms. 05/21/14  Yes Amber Constable, PA-C  montelukast (SINGULAIR) 10 MG tablet Take 10 mg by mouth at bedtime.   Yes Historical Provider, MD  naproxen sodium (ANAPROX) 220 MG tablet Take 440 mg by mouth 2 (two) times daily as needed (Pain).   Yes Historical Provider, MD  polyvinyl alcohol (LIQUIFILM  TEARS) 1.4 % ophthalmic solution Place 1 drop into both eyes 3 (three) times daily as needed for dry eyes.   Yes Historical Provider, MD  meloxicam (MOBIC) 7.5 MG tablet Take 7.5 mg by mouth daily.    Historical Provider, MD   BP 130/90 mmHg  Pulse 86  Temp(Src) 98.5 F (36.9 C) (Oral)  Resp 19  SpO2 96%  LMP 02/12/2014 Physical Exam  Constitutional: She is oriented to person, place, and time. She appears well-developed and well-nourished. No distress.  HENT:  Head: Normocephalic and atraumatic.  Mouth/Throat: Oropharynx is clear and moist.  Eyes: EOM are normal. Pupils are equal, round, and reactive to light.  Neck: Normal range of motion. Neck supple.  Cardiovascular: Normal rate and regular rhythm.  Exam reveals no friction rub.   No murmur heard. Pulmonary/Chest: Effort normal and breath sounds normal. No respiratory distress. She has no wheezes. She has no rales.  Abdominal: Soft. She exhibits no distension. There is no tenderness. There is no rebound.  Musculoskeletal: Normal range of motion. She exhibits no edema.  R shoulder not warm, dressing without surrounding cellulitis, pain.   Neurological: She is alert and oriented to person, place, and time.  Skin: She is not diaphoretic.  Nursing note and vitals reviewed.   ED Course  Procedures (including critical care time) Labs Review Labs Reviewed  CBC - Abnormal; Notable for the following:    WBC 11.1 (*)    All other components within normal limits  BASIC METABOLIC PANEL - Abnormal; Notable for the following:    Glucose, Bld 113 (*)    GFR calc non Af Amer 81 (*)    All other components within normal limits    Imaging Review Dg Chest 1 View  05/22/2014   CLINICAL DATA:  Acute onset of chills, shaking and fever. Right shoulder pain, status post recent shoulder surgery. Initial encounter.  EXAM: CHEST  1 VIEW  COMPARISON:  CT of the chest performed 04/02/2014, and chest radiograph performed 03/05/2014  FINDINGS: The  lungs are well-aerated. Mild bibasilar opacities likely reflect atelectasis or scarring, some of which is present on the prior CT. A small right pleural effusion is suggested, with adjacent pleural thickening as previously noted, thought to be posttraumatic or postinfectious in nature. There is no evidence of pneumothorax.  The cardiomediastinal silhouette is within normal limits. No acute osseous abnormalities are seen. Thoracic and thoracolumbar spinal fusion rods are partially imaged.  IMPRESSION: Mild bibasilar atelectasis or scarring noted. Small right pleural effusions suggested. Adjacent pleural thickening again noted, thought to be posttraumatic or postinfectious in nature. No superimposed airspace consolidation seen.  If the patient's symptoms persist, follow-up CT could be considered on an elective nonemergent basis to ensure that the pleural thickening remains stable.   Electronically Signed   By: Beryle Beams.D.  On: 05/22/2014 20:00     EKG Interpretation None      MDM   Final diagnoses:  Chills  Right shoulder pain    42F here with chills, continued R shoulder pain. Acutely worsened since leaving the hospital a few hours ago. Patient's R shoulder not warm, no cellulitis. Had R rotator cuff surgery.  Patient was intubated for her procedure. She is <48 hours post-op. Will check CXR, labs. R shoulder appears well, doubt septic arthritis. CXR negative. Labs ok. I spoke with Dr. Thomasena Edis who is with [facility], he recommended f/u tomorrow.  Patient is feeling better with fentanyl, will switch from dilaudid to percocet. Stable for discharge.   Elwin Mocha, MD 05/23/14 [phone]

## 2014-05-22 NOTE — Discharge Instructions (Signed)

## 2014-05-22 NOTE — ED Notes (Signed)
Ice applied to the right shoulder.

## 2014-05-22 NOTE — Op Note (Signed)
NAMBascom Levels:  Carpenter, Jeanette              ACCOUNT NO.:  [id number]  MEDICAL RECORD NO.:  [id number]  LOCATION:  1609                         [facility]  PHYSICIAN:  Georges Lynchonald A. Sahil Milner, M.D.DATE OF BIRTH:  1960-07-29  DATE OF PROCEDURE:  05/21/2014 DATE OF DISCHARGE:                              OPERATIVE REPORT   SURGEON:  Georges Lynchonald A. Darrelyn HillockGioffre, M.D.  ASSISTANT:  Dimitri PedAmber Constable, P.A.  PREOPERATIVE DIAGNOSIS: 1. Severe impingement syndrome, right shoulder. 2. Torn rotator cuff tendon, right shoulder, secondary to above.  POSTOPERATIVE DIAGNOSIS: 1. Severe impingement syndrome, right shoulder. 2. Torn rotator cuff tendon, right shoulder, secondary to above.  OPERATION: 1. Open acromionectomy and acromioplasty, right shoulder. 2. Repair of rotator cuff tendon utilizing a dura span graft.  PROCEDURE IN DETAIL:  Under general anesthesia with the patient in a beach chair position, the appropriate time-out was first carried out.  I also marked the appropriate right shoulder in the holding area.  At this time, an incision was made over the anterior aspect of the acromion. Bleeders identified and cauterized.  The incision was extended slightly distally.  A small part of the deltoid tendon was opened.  The deltoid muscle was opened.  I then went down and noted a severe impingement of the right shoulder, and I brought the right shoulder up in the abduction that was severely impinging on the acromion.  She had marked wear and tear of the rotator cuff.  I protected the cuff with the Bennett retractor.  I did a partial acromionectomy and acromioplasty utilizing oscillating saw and a bur.  After this was done, we thoroughly checked to make sure we re-established the subacromial space.  I then thoroughly irrigated out the area several times and then bone waxed the undersurface of the acromion.  I also used some thrombin-soaked Gelfoam posteriorly.  At this point once we had the subacromial  space established, we then repaired rotator cuff tendon with a dura span graft in the usual fashion.  The wound then was rechecked and then the deltoid tendon and muscle were brought back together in the usual fashion.  The subcu was closed with running locking 4-0 Vicryl suture.  Sterile dressings were applied.  The patient had 2 g of IV Ancef preop.  She will be placed in a shoulder immobilizer, and we injected 20 mL of Exparel at the end of the procedure.          ______________________________ Georges Lynchonald A. Darrelyn HillockGioffre, M.D.     RAG/MEDQ  D:  05/21/2014  T:  05/21/2014  Job:  [id number]

## 2014-05-22 NOTE — ED Notes (Signed)
Pt alert, oriented, and ambulatory upon DC. She was advised to call Orthopedic doctor.

## 2014-05-22 NOTE — Evaluation (Signed)
Occupational Therapy Evaluation Patient Details Name: Jeanette Carpenter MRN: [id number] DOB: Aug 27, 1960 Today's Date: 05/22/2014    History of Present Illness s/p R RCR with graft   Clinical Impression   This 54 year old female was admitted for the above surgery.  All education was completed with her.  Will return later when friends arrive to make sure they feel comfortable with helping with shirt and sling, following precautions.  Pt will follow up with Dr Darrelyn Hillock for further rehab.    Follow Up Recommendations  Supervision/Assistance - 24 hour (initial and pt will follow up with Dr Darrelyn Hillock)    Equipment Recommendations  None recommended by OT    Recommendations for Other Services       Precautions / Restrictions Precautions Precautions: Shoulder Type of Shoulder Precautions: conservative protocol:  sling at all times, no pendulums, AROM elbow through fingers as tolerated Restrictions Weight Bearing Restrictions: Yes Other Position/Activity Restrictions: NWB RUE      Mobility Bed Mobility Overal bed mobility: Modified Independent             General bed mobility comments: HOB raised.  Pt will sleep in her chair  Transfers Overall transfer level: Needs assistance Equipment used: None Transfers: Sit to/from Stand Sit to Stand: Supervision              Balance Overall balance assessment:  (close guard for safety:  moves quickly)                                          ADL Overall ADL's : Needs assistance/impaired     Grooming: Standing;Supervision/safety   Upper Body Bathing: Moderate assistance;Sitting   Lower Body Bathing: Supervison/ safety;Sit to/from stand   Upper Body Dressing : Moderate assistance;Sitting   Lower Body Dressing: Supervision/safety;Sit to/from stand   Toilet Transfer: Min guard;Ambulation   Toileting- Clothing Manipulation and Hygiene: Supervision/safety;Sit to/from stand         General ADL Comments:  min guard for ambulation for safety: pt feels a little "fuzzy" from medication. Completed ADL from EOB. She is very independent and has 2 roommates who will help her.  One roommate has shaky hands, so she likely won't be able to assist with sling and shirt. Pt verbalizes all education.  Called ortho tech to bring medium sling immobilizer as small one she is in does not support wrist.  All education completed with pt:  will stop by when friends arrive to review education.  Educated on sponge bathing until she feels very steady as she has a tub at home. She has a grab bar but she won't be able to use this as she can only use LUE     Vision     Perception     Praxis      Pertinent Vitals/Pain Pain Assessment: 0-10 Pain Score: 7  Pain Location: R shoulder with activity Pain Descriptors / Indicators: Aching;Sore Pain Intervention(s): Limited activity within patient's tolerance;Monitored during session;Premedicated before session;Repositioned;Ice applied     Hand Dominance Right   Extremity/Trunk Assessment Upper Extremity Assessment Upper Extremity Assessment: RUE deficits/detail RUE Deficits / Details: immobilized; wrist and fingers WFLs           Communication Communication Communication: No difficulties   Cognition Arousal/Alertness: Awake/alert Behavior During Therapy: WFL for tasks assessed/performed Overall Cognitive Status: Within Functional Limits for tasks assessed  General Comments       Exercises       Shoulder Instructions      Home Living Family/patient expects to be discharged to:: Private residence Living Arrangements: Non-relatives/Friends Available Help at Discharge: Family;Friend(s) Type of Home: Apartment                                  Prior Functioning/Environment Level of Independence: Independent             OT Diagnosis: Generalized weakness;Acute pain   OT Problem List: Pain;Impaired UE  functional use;Decreased strength;Impaired balance (sitting and/or standing) (decreased caregiver education )   OT Treatment/Interventions: Self-care/ADL training;DME and/or AE instruction;Patient/family education    OT Goals(Current goals can be found in the care plan section) Acute Rehab OT Goals Patient Stated Goal: be as independent as possible while healing OT Goal Formulation: With patient Time For Goal Achievement: 05/29/14 Potential to Achieve Goals: Good ADL Goals Additional ADL Goal #1: caregivers will be able to assist pt with shirt and donning/doffing sling with supervision, following shoulder precautions  OT Frequency: Min 2X/week   Barriers to D/C:            Co-evaluation              End of Session    Activity Tolerance: Patient tolerated treatment well Patient left: in chair;with call bell/phone within reach   Time: 0801-0846 OT Time Calculation (min): 45 min Charges:  OT General Charges $OT Visit: 1 Procedure OT Evaluation $Initial OT Evaluation Tier I: 1 Procedure OT Treatments $Self Care/Home Management : 23-37 mins G-Codes: OT G-codes **NOT FOR INPATIENT CLASS** Functional Assessment Tool Used: clinical observation Functional Limitation: Self care Self Care Current Status (A5409(G8987): At least 40 percent but less than 60 percent impaired, limited or restricted Self Care Goal Status (W1191(G8988): At least 40 percent but less than 60 percent impaired, limited or restricted  [name] 05/22/2014, 10:10 AM Marica OtterMaryellen Domenik Trice, OTR/L [phone] 05/22/2014

## 2014-05-22 NOTE — ED Notes (Signed)
Patient complaining of red splotches and lip swelling

## 2014-05-22 NOTE — Progress Notes (Signed)
   Subjective: 1 Day Post-Op Procedure(s) (LRB): RIGHT SHOULDER ACROIMECTOMY , GRAFT WITH RIGHT ROTATOR CUFF REPAIR  (Right) Patient reports pain as mild.   Patient is well, and has had no acute complaints or problems. She had some issues with nausea yesterday but is feeling better today. No issues overnight. No SOB or chest pain. No numbness or tingling in the arm.    Objective: Vital signs in last 24 hours: Temp:  [97.2 F (36.2 C)-99.1 F (37.3 C)] 97.9 F (36.6 C) (03/17 0503) Pulse Rate:  [69-88] 75 (03/17 0503) Resp:  [9-18] 14 (03/17 0503) BP: (101-132)/(61-88) 125/61 mmHg (03/17 0503) SpO2:  [96 %-100 %] 97 % (03/17 0503) Weight:  [63.957 kg (141 lb)] 63.957 kg (141 lb) (03/16 1318)  Intake/Output from previous day:  Intake/Output Summary (Last 24 hours) at 05/22/14 0740 Last data filed at 05/21/14 2300  Gross per 24 hour  Intake   1780 ml  Output    400 ml  Net   1380 ml     EXAM General - Patient is Alert and Oriented Extremity - Neurologically intact Sensation intact distally Intact pulses distally Dressing/Incision - clean, dry, no drainage   Past Medical History  Diagnosis Date  . Discoid lupus   . Scoliosis   . Lung collapse     right  . Asthma   . Rotator cuff tear     right  . Shortness of breath dyspnea     partial collapse lung on right- nothing new at this time  . Pneumonia 11/15  . Arthritis   . History of blood transfusion     Assessment/Plan: 1 Day Post-Op Procedure(s) (LRB): RIGHT SHOULDER ACROIMECTOMY , GRAFT WITH RIGHT ROTATOR CUFF REPAIR  (Right) Active Problems:   Rotator cuff tear, non-traumatic  Estimated body mass index is 24.19 kg/(m^2) as calculated from the following:   Height as of this encounter: 5\' 4"  (1.626 m).   Weight as of this encounter: 63.957 kg (141 lb). Advance diet D/C IV fluids  DVT Prophylaxis - Aspirin  Patient is doing well. Will DC home today. Discharge instructions discussed.   Dimitri PedAmber Aveena Bari,  PA-C Orthopaedic Surgery 05/22/2014, 7:40 AM

## 2014-05-22 NOTE — ED Notes (Signed)
Pt c/o chills, shaking, and fever x 2 hours and R shoulder pain after having surgery yesterday.  Pain score 10/10.  Pt reports taking prescribed pain medication w/o relief.  Pt was discharged x 2 hours ago.

## 2014-05-26 NOTE — Discharge Summary (Signed)
Physician Discharge Summary   Patient ID: Jeanette Carpenter MRN: [id number] DOB/AGE: 1960/07/18 54 y.o.  Admit date: 05/21/2014 Discharge date: 05/22/2014  Primary Diagnosis: Impingement right shoulder and partial rotator cuff tear  Admission Diagnoses:  Past Medical History  Diagnosis Date  . Discoid lupus   . Scoliosis   . Lung collapse     right  . Asthma   . Rotator cuff tear     right  . Shortness of breath dyspnea     partial collapse lung on right- nothing new at this time  . Pneumonia 11/15  . Arthritis   . History of blood transfusion    Discharge Diagnoses:   Active Problems:   Rotator cuff tear, non-traumatic  Estimated body mass index is 24.19 kg/(m^2) as calculated from the following:   Height as of this encounter: 5' 4"  (1.626 m).   Weight as of this encounter: 63.957 kg (141 lb).  Procedure:  Procedure(s) (LRB): RIGHT SHOULDER ACROIMECTOMY , GRAFT WITH RIGHT ROTATOR CUFF REPAIR  (Right)   Consults: None  HPI: The patient presented with the chief complaint of right shoulder pain x 2-3 months. No injury. The patient reports symptoms which include shoulder pain, tenderness, shoulder stiffness, decreased range of motion, inability to lay on that side, weakness and neck pain. The patient reports symptoms that radiate to the right upper arm. The patient describes these symptoms as moderate in severity and worsening. Symptoms are exacerbated by motion at the shoulder and lifting. Associated symptoms include weakness in the arm and pain in the arm. Current treatment includes nonsteroidal anti-inflammatory drugs (Meloxicam 7.5 mg qd). MRI showed right shoulder rotator cuff impingement and partial tear.  Laboratory Data: Admission on 05/22/2014, Discharged on 05/22/2014  Component Date Value Ref Range Status  . WBC 05/22/2014 11.1* 4.0 - 10.5 K/uL Final  . RBC 05/22/2014 4.53  3.87 - 5.11 MIL/uL Final  . Hemoglobin 05/22/2014 13.1  12.0 - 15.0 g/dL Final  . HCT  05/22/2014 41.0  36.0 - 46.0 % Final  . MCV 05/22/2014 90.5  78.0 - 100.0 fL Final  . MCH 05/22/2014 28.9  26.0 - 34.0 pg Final  . MCHC 05/22/2014 32.0  30.0 - 36.0 g/dL Final  . RDW 05/22/2014 13.7  11.5 - 15.5 % Final  . Platelets 05/22/2014 222  150 - 400 K/uL Final  . Sodium 05/22/2014 139  135 - 145 mmol/L Final  . Potassium 05/22/2014 3.9  3.5 - 5.1 mmol/L Final  . Chloride 05/22/2014 104  96 - 112 mmol/L Final  . CO2 05/22/2014 28  19 - 32 mmol/L Final  . Glucose, Bld 05/22/2014 113* 70 - 99 mg/dL Final  . BUN 05/22/2014 13  6 - 23 mg/dL Final  . Creatinine, Ser 05/22/2014 0.82  0.50 - 1.10 mg/dL Final  . Calcium 05/22/2014 9.1  8.4 - 10.5 mg/dL Final  . GFR calc non Af Amer 05/22/2014 81* >90 mL/min Final  . GFR calc Af Amer 05/22/2014 >90  >90 mL/min Final   Comment: (NOTE) The eGFR has been calculated using the CKD EPI equation. This calculation has not been validated in all clinical situations. eGFR's persistently <90 mL/min signify possible Chronic Kidney Disease.   Georgiann Hahn gap 05/22/2014 7  5 - 15 Final  Hospital Outpatient Visit on 05/14/2014  Component Date Value Ref Range Status  . aPTT 05/14/2014 31  24 - 37 seconds Final  . WBC 05/14/2014 5.3  4.0 - 10.5 K/uL Final  . RBC 05/14/2014 4.83  3.87 - 5.11 MIL/uL Final  . Hemoglobin 05/14/2014 13.9  12.0 - 15.0 g/dL Final  . HCT 05/14/2014 43.4  36.0 - 46.0 % Final  . MCV 05/14/2014 89.9  78.0 - 100.0 fL Final  . MCH 05/14/2014 28.8  26.0 - 34.0 pg Final  . MCHC 05/14/2014 32.0  30.0 - 36.0 g/dL Final  . RDW 05/14/2014 13.5  11.5 - 15.5 % Final  . Platelets 05/14/2014 206  150 - 400 K/uL Final  . Neutrophils Relative % 05/14/2014 63  43 - 77 % Final  . Neutro Abs 05/14/2014 3.4  1.7 - 7.7 K/uL Final  . Lymphocytes Relative 05/14/2014 30  12 - 46 % Final  . Lymphs Abs 05/14/2014 1.6  0.7 - 4.0 K/uL Final  . Monocytes Relative 05/14/2014 5  3 - 12 % Final  . Monocytes Absolute 05/14/2014 0.2  0.1 - 1.0 K/uL Final  .  Eosinophils Relative 05/14/2014 2  0 - 5 % Final  . Eosinophils Absolute 05/14/2014 0.1  0.0 - 0.7 K/uL Final  . Basophils Relative 05/14/2014 0  0 - 1 % Final  . Basophils Absolute 05/14/2014 0.0  0.0 - 0.1 K/uL Final  . Sodium 05/14/2014 140  135 - 145 mmol/L Final  . Potassium 05/14/2014 4.2  3.5 - 5.1 mmol/L Final  . Chloride 05/14/2014 102  96 - 112 mmol/L Final  . CO2 05/14/2014 30  19 - 32 mmol/L Final  . Glucose, Bld 05/14/2014 104* 70 - 99 mg/dL Final  . BUN 05/14/2014 12  6 - 23 mg/dL Final  . Creatinine, Ser 05/14/2014 0.83  0.50 - 1.10 mg/dL Final  . Calcium 05/14/2014 9.6  8.4 - 10.5 mg/dL Final  . Total Protein 05/14/2014 7.5  6.0 - 8.3 g/dL Final  . Albumin 05/14/2014 4.5  3.5 - 5.2 g/dL Final  . AST 05/14/2014 25  0 - 37 U/L Final  . ALT 05/14/2014 20  0 - 35 U/L Final  . Alkaline Phosphatase 05/14/2014 62  39 - 117 U/L Final  . Total Bilirubin 05/14/2014 0.6  0.3 - 1.2 mg/dL Final  . GFR calc non Af Amer 05/14/2014 80* >90 mL/min Final  . GFR calc Af Amer 05/14/2014 >90  >90 mL/min Final   Comment: (NOTE) The eGFR has been calculated using the CKD EPI equation. This calculation has not been validated in all clinical situations. eGFR's persistently <90 mL/min signify possible Chronic Kidney Disease.   . Anion gap 05/14/2014 8  5 - 15 Final  . Prothrombin Time 05/14/2014 13.0  11.6 - 15.2 seconds Final  . INR 05/14/2014 0.97  0.00 - 1.49 Final  . Color, Urine 05/14/2014 YELLOW  YELLOW Final  . APPearance 05/14/2014 CLEAR  CLEAR Final  . Specific Gravity, Urine 05/14/2014 1.007  1.005 - 1.030 Final  . pH 05/14/2014 5.5  5.0 - 8.0 Final  . Glucose, UA 05/14/2014 NEGATIVE  NEGATIVE mg/dL Final  . Hgb urine dipstick 05/14/2014 NEGATIVE  NEGATIVE Final  . Bilirubin Urine 05/14/2014 NEGATIVE  NEGATIVE Final  . Ketones, ur 05/14/2014 NEGATIVE  NEGATIVE mg/dL Final  . Protein, ur 05/14/2014 NEGATIVE  NEGATIVE mg/dL Final  . Urobilinogen, UA 05/14/2014 0.2  0.0 - 1.0 mg/dL  Final  . Nitrite 05/14/2014 NEGATIVE  NEGATIVE Final  . Leukocytes, UA 05/14/2014 NEGATIVE  NEGATIVE Final   MICROSCOPIC NOT DONE ON URINES WITH NEGATIVE PROTEIN, BLOOD, LEUKOCYTES, NITRITE, OR GLUCOSE <1000 mg/dL.  . Preg, Serum 05/14/2014 NEGATIVE  NEGATIVE Final   Comment:  THE SENSITIVITY OF THIS METHODOLOGY IS >10 mIU/mL.      X-Rays:Dg Chest 1 View  05/22/2014   CLINICAL DATA:  Acute onset of chills, shaking and fever. Right shoulder pain, status post recent shoulder surgery. Initial encounter.  EXAM: CHEST  1 VIEW  COMPARISON:  CT of the chest performed 04/02/2014, and chest radiograph performed 03/05/2014  FINDINGS: The lungs are well-aerated. Mild bibasilar opacities likely reflect atelectasis or scarring, some of which is present on the prior CT. A small right pleural effusion is suggested, with adjacent pleural thickening as previously noted, thought to be posttraumatic or postinfectious in nature. There is no evidence of pneumothorax.  The cardiomediastinal silhouette is within normal limits. No acute osseous abnormalities are seen. Thoracic and thoracolumbar spinal fusion rods are partially imaged.  IMPRESSION: Mild bibasilar atelectasis or scarring noted. Small right pleural effusions suggested. Adjacent pleural thickening again noted, thought to be posttraumatic or postinfectious in nature. No superimposed airspace consolidation seen.  If the patient's symptoms persist, follow-up CT could be considered on an elective nonemergent basis to ensure that the pleural thickening remains stable.   Electronically Signed   By: Garald Balding M.D.   On: 05/22/2014 20:00    EKG: Orders placed or performed during the hospital encounter of 05/14/14  . EKG  . EKG     Hospital Course: Jeanette Carpenter is a 54 y.o. who was admitted to [facility]. They were brought to the operating room on 05/21/2014 and underwent Procedure(s): [place] , GRAFT WITH RIGHT [place] .  Patient tolerated the procedure well and was later transferred to the recovery room and then to the orthopaedic floor for postoperative care.  They were given PO and IV analgesics for pain control following their surgery.  They were given 24 hours of postoperative antibiotics of  Anti-infectives    Start     Dose/Rate Route Frequency Ordered Stop   05/21/14 1630  ceFAZolin (ANCEF) IVPB 1 g/50 mL premix     1 g 100 mL/hr over 30 Minutes Intravenous Every 6 hours 05/21/14 1330 05/22/14 0417   05/21/14 1108  polymyxin B 500,000 Units, bacitracin 50,000 Units in sodium chloride irrigation 0.9 % 500 mL irrigation  Status:  Discontinued       As needed 05/21/14 1108 05/21/14 1139   05/21/14 0821  ceFAZolin (ANCEF) IVPB 2 g/50 mL premix     2 g 100 mL/hr over 30 Minutes Intravenous On call to O.R. 05/21/14 1660 05/21/14 1021     and started on DVT prophylaxis in the form of Aspirin.    Discharge planning consulted to help with postop disposition and equipment needs.  Patient had a difficult night on the evening of surgery due to nausea but was feeling much better post op day one. They started to get up OOB..   Patient was seen in rounds and was ready to go home.   Diet: Cardiac diet Activity: Wear sling at all times Follow-up:in 2 weeks Disposition - Home Discharged Condition: stable   Discharge Instructions    Call MD / Call 911    Complete by:  As directed   If you experience chest pain or shortness of breath, CALL 911 and be transported to the hospital emergency room.  If you develope a fever above 101 F, pus (white drainage) or increased drainage or redness at the wound, or calf pain, call your surgeon's office.     Constipation Prevention    Complete by:  As directed   Drink plenty of fluids.  Prune juice may be helpful.  You may use a stool softener, such as Colace (over the counter) 100 mg twice a day.  Use MiraLax (over the counter) for constipation as needed.     Diet -  low sodium heart healthy    Complete by:  As directed      Discharge instructions    Complete by:  As directed   Keep your sling on at all times, including sleeping in your sling. The only time you should remove your sling is to shower only but you need to keep your hand against your chest while you shower.  You may shower with dressing on. Do not remove dressing unless it appears compromised. Shower only, no tub bath. Call if any temperatures greater than 101 or any wound complications: [phone] during the day and ask for Dr. Charlestine Night nurse, Brunilda Payor.     Driving restrictions    Complete by:  As directed   No driving     Increase activity slowly as tolerated    Complete by:  As directed      Lifting restrictions    Complete by:  As directed   No lifting            Medication List    TAKE these medications        albuterol (2.5 MG/3ML) 0.083% nebulizer solution  Commonly known as:  PROVENTIL  Take 2.5 mg by nebulization every 6 (six) hours as needed for wheezing or shortness of breath.     amitriptyline 10 MG tablet  Commonly known as:  ELAVIL  Take 20 mg by mouth at bedtime.     Fluticasone-Salmeterol 250-50 MCG/DOSE Aepb  Commonly known as:  ADVAIR  Inhale 1 puff into the lungs 2 (two) times daily.     HYDROmorphone 2 MG tablet  Commonly known as:  DILAUDID  Take 1 tablet (2 mg total) by mouth every 3 (three) hours as needed for severe pain (Q4-6 hours PRN).     ipratropium 17 MCG/ACT inhaler  Commonly known as:  ATROVENT HFA  Inhale 2 puffs into the lungs every 4 (four) hours as needed for wheezing.     levalbuterol 45 MCG/ACT inhaler  Commonly known as:  XOPENEX HFA  Inhale 2 puffs into the lungs every 6 (six) hours as needed for wheezing.     meloxicam 7.5 MG tablet  Commonly known as:  MOBIC  Take 7.5 mg by mouth daily.     methocarbamol 500 MG tablet  Commonly known as:  ROBAXIN  Take 1 tablet (500 mg total) by mouth every 6 (six) hours as needed  for muscle spasms.     montelukast 10 MG tablet  Commonly known as:  SINGULAIR  Take 10 mg by mouth at bedtime.     naproxen sodium 220 MG tablet  Commonly known as:  ANAPROX  Take 440 mg by mouth 2 (two) times daily as needed (Pain).     polyvinyl alcohol 1.4 % ophthalmic solution  Commonly known as:  LIQUIFILM TEARS  Place 1 drop into both eyes 3 (three) times daily as needed for dry eyes.     VITAMIN B-12 IJ  Inject as directed every 30 (thirty) days.           Follow-up Information    Follow up with GIOFFRE,RONALD A, MD. Schedule an appointment as soon as possible for a visit in 2 weeks.   Specialty:  Orthopedic Surgery   Contact information:   [address] [place] [address] [phone]       Signed: Ardeen Jourdain, PA-C Orthopaedic Surgery 05/26/2014, 10:54 AM

## 2014-08-19 MED ORDER — BUDESONIDE-FORMOTEROL HFA 160 MCG-4.5 MCG/ACTUATION AEROSOL INHALER
Freq: Two times a day (BID) | RESPIRATORY_TRACT | Status: AC
Start: 2014-08-19 — End: ?

## 2014-08-19 NOTE — Telephone Encounter (Signed)
According to fax received the preferred formulary has changed again from Advair Diskus 500/12mcg to Symbicort or [facility]

## 2014-12-01 MED ORDER — FLUTICASONE-SALMETEROL 250 MCG-50 MCG/DOSE DISK DEVICE FOR INHALATION
250-50 mcg/dose | RESPIRATORY_TRACT | 11 refills | Status: AC
Start: 2014-12-01 — End: ?

## 2015-03-16 ENCOUNTER — Emergency Department (HOSPITAL_COMMUNITY)
Admission: EM | Admit: 2015-03-16 | Discharge: 2015-03-16 | Disposition: A | Discharge status: Home / Self Care | Payer: Medicare Other | Attending: Emergency Medicine | Admitting: Emergency Medicine

## 2015-03-16 ENCOUNTER — Encounter (HOSPITAL_COMMUNITY): Payer: Self-pay | Admitting: Emergency Medicine

## 2015-03-16 ENCOUNTER — Emergency Department (HOSPITAL_COMMUNITY): Payer: Medicare Other

## 2015-03-16 DIAGNOSIS — Z8701 Personal history of pneumonia (recurrent): Secondary | ICD-10-CM | POA: Diagnosis not present

## 2015-03-16 DIAGNOSIS — Z791 Long term (current) use of non-steroidal anti-inflammatories (NSAID): Secondary | ICD-10-CM | POA: Insufficient documentation

## 2015-03-16 DIAGNOSIS — M79672 Pain in left foot: Secondary | ICD-10-CM | POA: Diagnosis present

## 2015-03-16 DIAGNOSIS — J45909 Unspecified asthma, uncomplicated: Secondary | ICD-10-CM | POA: Insufficient documentation

## 2015-03-16 DIAGNOSIS — Z7951 Long term (current) use of inhaled steroids: Secondary | ICD-10-CM | POA: Insufficient documentation

## 2015-03-16 DIAGNOSIS — Z79899 Other long term (current) drug therapy: Secondary | ICD-10-CM | POA: Diagnosis not present

## 2015-03-16 DIAGNOSIS — M2570 Osteophyte, unspecified joint: Secondary | ICD-10-CM | POA: Insufficient documentation

## 2015-03-16 DIAGNOSIS — M1990 Unspecified osteoarthritis, unspecified site: Secondary | ICD-10-CM | POA: Diagnosis not present

## 2015-03-16 DIAGNOSIS — G8929 Other chronic pain: Secondary | ICD-10-CM | POA: Diagnosis not present

## 2015-03-16 DIAGNOSIS — Z9889 Other specified postprocedural states: Secondary | ICD-10-CM | POA: Insufficient documentation

## 2015-03-16 DIAGNOSIS — Z872 Personal history of diseases of the skin and subcutaneous tissue: Secondary | ICD-10-CM | POA: Diagnosis not present

## 2015-03-16 DIAGNOSIS — M779 Enthesopathy, unspecified: Secondary | ICD-10-CM

## 2015-03-16 NOTE — Discharge Instructions (Signed)

## 2015-03-16 NOTE — ED Notes (Signed)
Patient presents for left foot pain, reports horse stepping on foot in 2003, recently noticed "growth" x2 months to same. Unable to see podiatrist at this time. Rates pain 10/10. Denies numbness or tingling. Ambulatory in triage with steady gait.

## 2015-03-16 NOTE — ED Provider Notes (Signed)
CSN: [id number]     Arrival date & time 03/16/15  1521 History  By signing my name below, I, Soijett Blue, attest that this documentation has been prepared under the direction and in the presence of Roxy Horseman, PA-C Electronically Signed: Soijett Blue, ED Scribe. 03/16/2015. 4:01 PM.   Chief Complaint  Patient presents with  . Foot Pain      The history is provided by the patient. No language interpreter was used.    Jeanette Carpenter is a 55 y.o. female with a medical hx of arthritis and lupus who presents to the Emergency Department complaining of 10/10 left foot pain onset 2 months. She reports that she injured her left foot in 2003 when a horse stepped on her foot. She states that she recently noticed a "growth" x 2 months to her left foot. She reports that her pain is worsened with bearing weight and alleviated with rest. She notes that she has tried going to her PCP to obtain a podiatrist referral with no luck. She notes that she has tried oxycodone with no relief of her symptoms. She denies gait problem, color change, and any other symptoms.    Past Medical History  Diagnosis Date  . Discoid lupus   . Scoliosis   . Lung collapse     right  . Asthma   . Rotator cuff tear     right  . Shortness of breath dyspnea     partial collapse lung on right- nothing new at this time  . Pneumonia 11/15  . Arthritis   . History of blood transfusion    Past Surgical History  Procedure Laterality Date  . Back surgery  1979    scoliosis spinal fusion  . Spinal fusion  1979    2 rods in back, upper right, lower left  . Cardiac catheterization    . Shoulder open rotator cuff repair Right 05/21/2014    Procedure: RIGHT SHOULDER ACROIMECTOMY , GRAFT WITH RIGHT ROTATOR CUFF REPAIR ;  Surgeon: Ranee Gosselin, MD;  Location: [facility] ORS;  Service: Orthopedics;  Laterality: Right;   Family History  Problem Relation Age of Onset  . Emphysema Mother   . Asthma Mother   . Cancer Mother    Social  History  Substance Use Topics  . Smoking status: Never Smoker   . Smokeless tobacco: Never Used  . Alcohol Use: 0.0 oz/week    0 Standard drinks or equivalent per week     Comment: rarely   OB History    No data available     Review of Systems  Musculoskeletal: Positive for arthralgias. Negative for gait problem.  Skin: Negative for color change and wound.     Allergies  Lyrica; Tramadol; and Dilaudid  Home Medications   Prior to Admission medications   Medication Sig Start Date End Date Taking? Authorizing Provider  albuterol (PROVENTIL) (2.5 MG/3ML) 0.083% nebulizer solution Take 2.5 mg by nebulization every 6 (six) hours as needed for wheezing or shortness of breath.    Historical Provider, MD  ALPRAZolam Prudy Feeler) 0.5 MG tablet Take 1 tablet (0.5 mg total) by mouth 3 (three) times daily as needed for anxiety. 05/22/14   Elwin Mocha, MD  amitriptyline (ELAVIL) 10 MG tablet Take 20 mg by mouth at bedtime.    Historical Provider, MD  Cyanocobalamin (VITAMIN B-12 IJ) Inject as directed every 30 (thirty) days.    Historical Provider, MD  Fluticasone-Salmeterol (ADVAIR) 250-50 MCG/DOSE AEPB Inhale 1 puff into the lungs 2 (  two) times daily.    Historical Provider, MD  HYDROmorphone (DILAUDID) 2 MG tablet Take 1 tablet (2 mg total) by mouth every 3 (three) hours as needed for severe pain (Q4-6 hours PRN). 05/21/14   Amber Celedonio Savage, PA-C  ipratropium (ATROVENT HFA) 17 MCG/ACT inhaler Inhale 2 puffs into the lungs every 4 (four) hours as needed for wheezing.    Historical Provider, MD  levalbuterol [facility] HFA) 45 MCG/ACT inhaler Inhale 2 puffs into the lungs every 6 (six) hours as needed for wheezing.    Historical Provider, MD  meloxicam (MOBIC) 7.5 MG tablet Take 7.5 mg by mouth daily.    Historical Provider, MD  methocarbamol (ROBAXIN) 500 MG tablet Take 1 tablet (500 mg total) by mouth every 6 (six) hours as needed for muscle spasms. 05/21/14   Amber Constable, PA-C  montelukast  (SINGULAIR) 10 MG tablet Take 10 mg by mouth at bedtime.    Historical Provider, MD  naproxen sodium (ANAPROX) 220 MG tablet Take 440 mg by mouth 2 (two) times daily as needed (Pain).    Historical Provider, MD  oxyCODONE-acetaminophen (PERCOCET) 5-325 MG per tablet Take 1 tablet by mouth every 6 (six) hours as needed for moderate pain. 05/22/14   Elwin Mocha, MD  polyvinyl alcohol (LIQUIFILM TEARS) 1.4 % ophthalmic solution Place 1 drop into both eyes 3 (three) times daily as needed for dry eyes.    Historical Provider, MD   BP 149/103 mmHg  Pulse 91  Temp(Src) 97.7 F (36.5 C)  Resp 18  SpO2 100%  LMP 02/12/2014 Physical Exam Physical Exam  Constitutional: Pt appears well-developed and well-nourished. No distress.  HENT:  Head: Normocephalic and atraumatic.  Eyes: Conjunctivae are normal.  Neck: Normal range of motion.  Cardiovascular: Normal rate, regular rhythm and intact distal pulses.   Capillary refill < 3 sec  Pulmonary/Chest: Effort normal and breath sounds normal.  Musculoskeletal: Pt exhibits tenderness over the posterior left foot. Moderate bony prominence of the first metatarsal. Pt exhibits no edema.  ROM: 5/5  Neurological: Pt  is alert. Coordination normal.  Sensation 5/5 Strength 5/5  Skin: Skin is warm and dry. Pt is not diaphoretic.  No tenting of the skin  Psychiatric: Pt has a normal mood and affect.  Nursing note and vitals reviewed.  ED Course  Procedures (including critical care time) DIAGNOSTIC STUDIES: Oxygen Saturation is 100% on RA, nl by my interpretation.    COORDINATION OF CARE: 3:59 PM Discussed treatment plan with pt at bedside which includes left foot xray and pt agreed to plan.     Imaging Review Dg Foot Complete Left  03/16/2015  CLINICAL DATA:  Left foot pain starting 2 months ago. Remote injury. EXAM: LEFT FOOT - COMPLETE 3+ VIEW COMPARISON:  None. FINDINGS: Mild spurring of the first metatarsal head. Normal Lisfranc joint alignment.  Plantar calcaneal spur. Mild spurring at the Lisfranc joint and at the proximal intermetatarsal articulations between the first and second metatarsals. IMPRESSION: 1. Mild degenerative spurring of the first metatarsal head and mild spurring at the Lisfranc joint, but no acute findings. 2. Plantar calcaneal spur. 3. If pain persists despite conservative therapy, MRI may be warranted for further characterization. Electronically Signed   By: Gaylyn Rong M.D.   On: 03/16/2015 16:37   I have personally reviewed and evaluated these images as part of my medical decision-making.    MDM   Final diagnoses:  Bone spur  Left foot pain    Patient chronic foot pain.  Plain  films remarkable for bone spur.  Recommend f/u with ortho.  I personally performed the services described in this documentation, which was scribed in my presence. The recorded information has been reviewed and is accurate.      Roxy Horsemanobert Lizbet Cirrincione, PA-C 03/16/15 1651  Loren Raceravid Yelverton, MD 03/17/15 [phone]

## 2015-05-31 ENCOUNTER — Encounter (HOSPITAL_COMMUNITY): Payer: Self-pay | Admitting: Emergency Medicine

## 2015-05-31 ENCOUNTER — Emergency Department (HOSPITAL_COMMUNITY)
Admission: EM | Admit: 2015-05-31 | Discharge: 2015-05-31 | Disposition: A | Discharge status: Home / Self Care | Payer: Medicare Other | Attending: Emergency Medicine | Admitting: Emergency Medicine

## 2015-05-31 DIAGNOSIS — Y998 Other external cause status: Secondary | ICD-10-CM | POA: Insufficient documentation

## 2015-05-31 DIAGNOSIS — Z79899 Other long term (current) drug therapy: Secondary | ICD-10-CM | POA: Diagnosis not present

## 2015-05-31 DIAGNOSIS — S80861A Insect bite (nonvenomous), right lower leg, initial encounter: Secondary | ICD-10-CM | POA: Diagnosis present

## 2015-05-31 DIAGNOSIS — Z791 Long term (current) use of non-steroidal anti-inflammatories (NSAID): Secondary | ICD-10-CM | POA: Diagnosis not present

## 2015-05-31 DIAGNOSIS — Y9289 Other specified places as the place of occurrence of the external cause: Secondary | ICD-10-CM | POA: Insufficient documentation

## 2015-05-31 DIAGNOSIS — J45909 Unspecified asthma, uncomplicated: Secondary | ICD-10-CM | POA: Insufficient documentation

## 2015-05-31 DIAGNOSIS — Y9389 Activity, other specified: Secondary | ICD-10-CM | POA: Diagnosis not present

## 2015-05-31 DIAGNOSIS — Z8701 Personal history of pneumonia (recurrent): Secondary | ICD-10-CM | POA: Insufficient documentation

## 2015-05-31 DIAGNOSIS — L03115 Cellulitis of right lower limb: Secondary | ICD-10-CM | POA: Diagnosis not present

## 2015-05-31 DIAGNOSIS — Z792 Long term (current) use of antibiotics: Secondary | ICD-10-CM | POA: Insufficient documentation

## 2015-05-31 DIAGNOSIS — W57XXXA Bitten or stung by nonvenomous insect and other nonvenomous arthropods, initial encounter: Secondary | ICD-10-CM | POA: Diagnosis not present

## 2015-05-31 DIAGNOSIS — Z872 Personal history of diseases of the skin and subcutaneous tissue: Secondary | ICD-10-CM | POA: Diagnosis not present

## 2015-05-31 DIAGNOSIS — M1990 Unspecified osteoarthritis, unspecified site: Secondary | ICD-10-CM | POA: Diagnosis not present

## 2015-05-31 DIAGNOSIS — Z7951 Long term (current) use of inhaled steroids: Secondary | ICD-10-CM | POA: Diagnosis not present

## 2015-05-31 DIAGNOSIS — Z9889 Other specified postprocedural states: Secondary | ICD-10-CM | POA: Diagnosis not present

## 2015-05-31 MED ORDER — CEPHALEXIN 500 MG PO CAPS: 500.0000 mg | ORAL_CAPSULE | Freq: Two times a day (BID) | ORAL | Status: DC

## 2015-05-31 NOTE — ED Notes (Signed)
Pt states she was bitten by something yesterday. She states she was moving things into a storage building all day yesterday and is unsure of what it was. Pt has darkened area to the back of her right calf. C/o pain to the area.

## 2015-05-31 NOTE — Discharge Instructions (Signed)

## 2015-05-31 NOTE — ED Provider Notes (Signed)
CSN: [id number]     Arrival date & time 05/31/15  1744 History  By signing my name below, I, Octavia Heirrianna Nassar, attest that this documentation has been prepared under the direction and in the presence of Everlene FarrierWilliam Wayde Gopaul, PA-C. Electronically Signed: Octavia HeirArianna Nassar, ED Scribe. 05/31/2015. 6:10 PM.    Chief Complaint  Patient presents with  . Insect Bite      The history is provided by the patient. No language interpreter was used.   HPI Comments: Jeanette Carpenter is a 55 y.o. female who presents to the Emergency Department complaining of a sudden onset, gradual improving, moderate, insect bite on the back of the right calf that occurred yesterday. Pt reports she was in her storage unit yesterday when she was bit by an unknown insect. Pt has a erythematous, tender area to the back of her right calf where she was bitten. She reports there was minor clear drainage from the area yesterday. She denies fevers, itching to the area and any other complaints.  Past Medical History  Diagnosis Date  . Discoid lupus   . Scoliosis   . Lung collapse     right  . Asthma   . Rotator cuff tear     right  . Shortness of breath dyspnea     partial collapse lung on right- nothing new at this time  . Pneumonia 11/15  . Arthritis   . History of blood transfusion    Past Surgical History  Procedure Laterality Date  . Back surgery  1979    scoliosis spinal fusion  . Spinal fusion  1979    2 rods in back, upper right, lower left  . Cardiac catheterization    . Shoulder open rotator cuff repair Right 05/21/2014    Procedure: RIGHT SHOULDER ACROIMECTOMY , GRAFT WITH RIGHT ROTATOR CUFF REPAIR ;  Surgeon: Ranee Gosselinonald Gioffre, MD;  Location: [facility];  Service: Orthopedics;  Laterality: Right;   Family History  Problem Relation Age of Onset  . Emphysema Mother   . Asthma Mother   . Cancer Mother    Social History  Substance Use Topics  . Smoking status: Never Smoker   . Smokeless tobacco: Never Used  . Alcohol  Use: 0.0 oz/week    0 Standard drinks or equivalent per week     Comment: rarely   OB History    No data available     Review of Systems  Constitutional: Negative for fever.  Skin: Positive for color change and wound.      Allergies  Lyrica; Oxycodone-acetaminophen; Tramadol; and Dilaudid  Home Medications   Prior to Admission medications   Medication Sig Start Date End Date Taking? Authorizing Provider  albuterol (PROVENTIL) (2.5 MG/3ML) 0.083% nebulizer solution Take 2.5 mg by nebulization every 6 (six) hours as needed for wheezing or shortness of breath.    Historical Provider, MD  ALPRAZolam Prudy Feeler(XANAX) 0.5 MG tablet Take 1 tablet (0.5 mg total) by mouth 3 (three) times daily as needed for anxiety. Patient not taking: Reported on 03/16/2015 05/22/14   Elwin MochaBlair Walden, MD  amitriptyline (ELAVIL) 25 MG tablet Take 25 mg by mouth at bedtime.    Historical Provider, MD  cephALEXin (KEFLEX) 500 MG capsule Take 1 capsule (500 mg total) by mouth 2 (two) times daily. 05/31/15   Everlene FarrierWilliam Paizlie Klaus, PA-C  Fluticasone-Salmeterol (ADVAIR) 250-50 MCG/DOSE AEPB Inhale 1 puff into the lungs 2 (two) times daily.    Historical Provider, MD  HYDROmorphone (DILAUDID) 2 MG tablet Take 1 tablet (  2 mg total) by mouth every 3 (three) hours as needed for severe pain (Q4-6 hours PRN). Patient not taking: Reported on 03/16/2015 05/21/14   Dimitri Ped, PA-C  ipratropium (ATROVENT HFA) 17 MCG/ACT inhaler Inhale 2 puffs into the lungs every 4 (four) hours as needed for wheezing. Reported on 03/16/2015    Historical Provider, MD  levalbuterol [facility] HFA) 45 MCG/ACT inhaler Inhale 2 puffs into the lungs every 6 (six) hours as needed for wheezing.    Historical Provider, MD  meloxicam (MOBIC) 7.5 MG tablet Take 7.5 mg by mouth daily.    Historical Provider, MD  methocarbamol (ROBAXIN) 500 MG tablet Take 1 tablet (500 mg total) by mouth every 6 (six) hours as needed for muscle spasms. Patient not taking: Reported on 03/16/2015  05/21/14   Dimitri Ped, PA-C  naproxen sodium (ANAPROX) 220 MG tablet Take 440 mg by mouth 2 (two) times daily as needed (Pain).    Historical Provider, MD  oxyCODONE-acetaminophen (PERCOCET) 5-325 MG per tablet Take 1 tablet by mouth every 6 (six) hours as needed for moderate pain. Patient not taking: Reported on 03/16/2015 05/22/14   Elwin Mocha, MD  polyvinyl alcohol (LIQUIFILM TEARS) 1.4 % ophthalmic solution Place 1 drop into both eyes 3 (three) times daily as needed for dry eyes.    Historical Provider, MD   Triage vitals: BP 108/74 mmHg  Pulse 85  Temp(Src) 97.6 F (36.4 C) (Oral)  Resp 14  SpO2 95%  LMP 02/12/2014 Physical Exam  Constitutional: She appears well-developed and well-nourished. No distress.  Nontoxic appearing.  HENT:  Head: Normocephalic and atraumatic.  Eyes: Right eye exhibits no discharge. Left eye exhibits no discharge.  Cardiovascular: Normal rate, regular rhythm and intact distal pulses.   Bilateral DP pulses intact, good cap refill  Pulmonary/Chest: Effort normal. No respiratory distress.  Musculoskeletal: Normal range of motion. She exhibits no edema or tenderness.  6 cm area of erythema to right posterior calf. See picture.  No induration, no fluctuance, no discharge. No calf edema or tenderness.   Neurological: She is alert. Coordination normal.  Skin: Skin is warm and dry. She is not diaphoretic. There is erythema. No pallor.  Psychiatric: She has a normal mood and affect. Her behavior is normal.  Nursing note and vitals reviewed.     ED Course  Procedures  DIAGNOSTIC STUDIES: Oxygen Saturation is 95% on RA, adequate by my interpretation.  COORDINATION OF CARE:  6:07 PM Discussed treatment plan which includes Keflex with pt at bedside and pt agreed to plan.  Labs Review Labs Reviewed - No data to display  Imaging Review No results found.    EKG Interpretation None      Filed Vitals:   05/31/15 1750  BP: 108/74  Pulse: 85  Temp:  97.6 F (36.4 C)  TempSrc: Oral  Resp: 14  SpO2: 95%     MDM   Meds given in ED:  Medications - No data to display  New Prescriptions   CEPHALEXIN (KEFLEX) 500 MG CAPSULE    Take 1 capsule (500 mg total) by mouth 2 (two) times daily.    Final diagnoses:  Cellulitis of right lower extremity   This is a 55 y.o. female who presents to the Emergency Department complaining of a sudden onset, gradual improving, moderate, insect bite on the back of the right calf that occurred yesterday. Pt reports she was in her storage unit yesterday when she was bit by an unknown insect. Pt has a erythematous, tender  area to the back of her right calf where she was bitten. She reports there was minor clear drainage from the area yesterday. On exam the patient is afebrile nontoxic appearing. She has an approximate 6 cm area of erythema to her right posterior calf. No induration or fluctuance. No discharge. edema or tenderness. Patient with cellulitis from probable insect bite. Patient's area marked with skin marker. Will start on Keflex. I encouraged her to follow-up with her primary care provider this week for recheck. I discussed strict return precautions including expected course and treatment of cellulitis. I advised the patient to follow-up with their primary care provider this week. I advised the patient to return to the emergency department with new or worsening symptoms or new concerns. The patient verbalized understanding and agreement with plan.    I personally performed the services described in this documentation, which was scribed in my presence. The recorded information has been reviewed and is accurate.       Everlene Farrier, PA-C 05/31/15 1815  Nelva Nay, MD 06/03/15 [phone]

## 2015-12-10 IMAGING — CR DG CHEST 2V
2 series · 2 of 2 positions shown · non-contrast
Comparison: None.

CLINICAL DATA: Cough for 1 week.

EXAM:
CHEST  2 VIEW

[w chest pa]
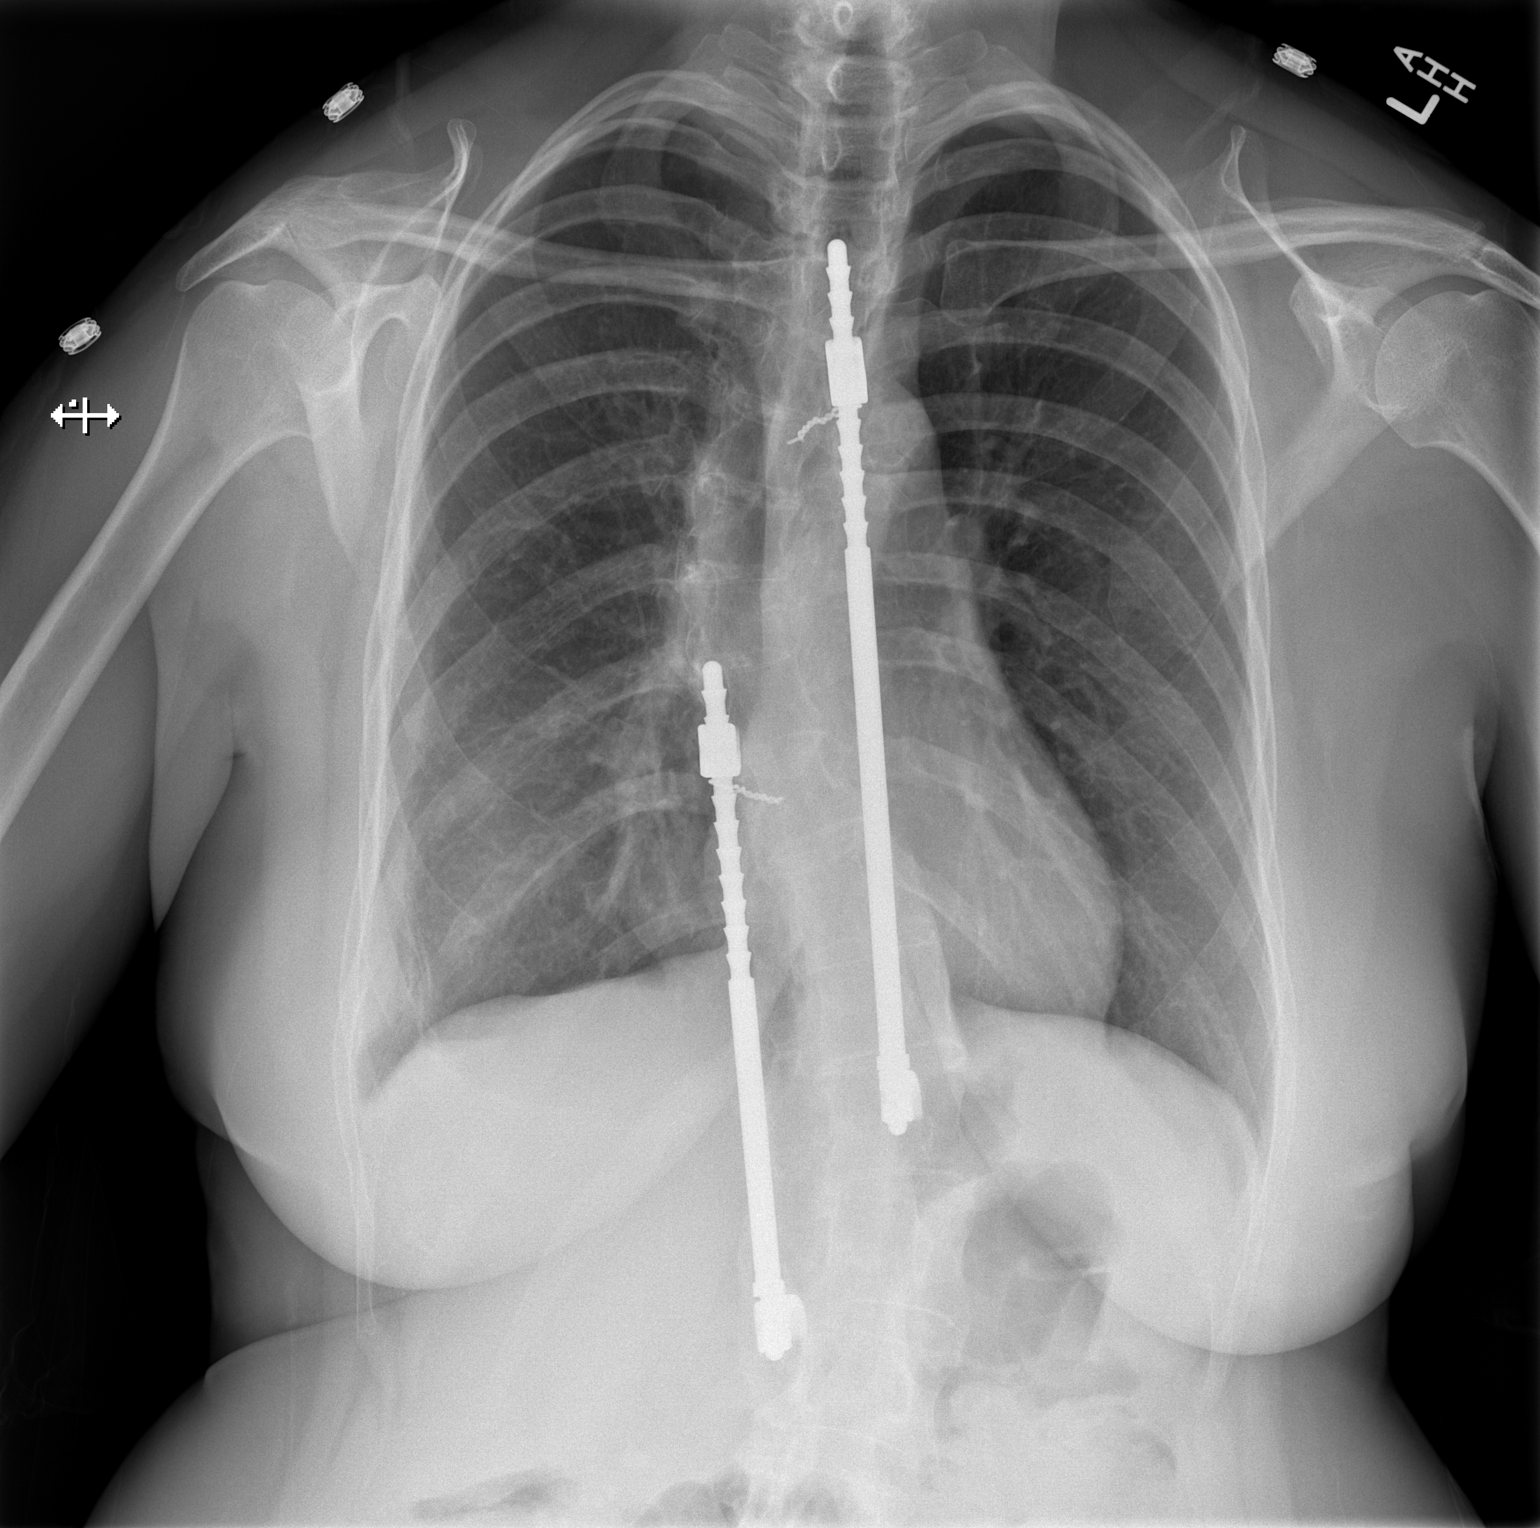

[w chest lat]
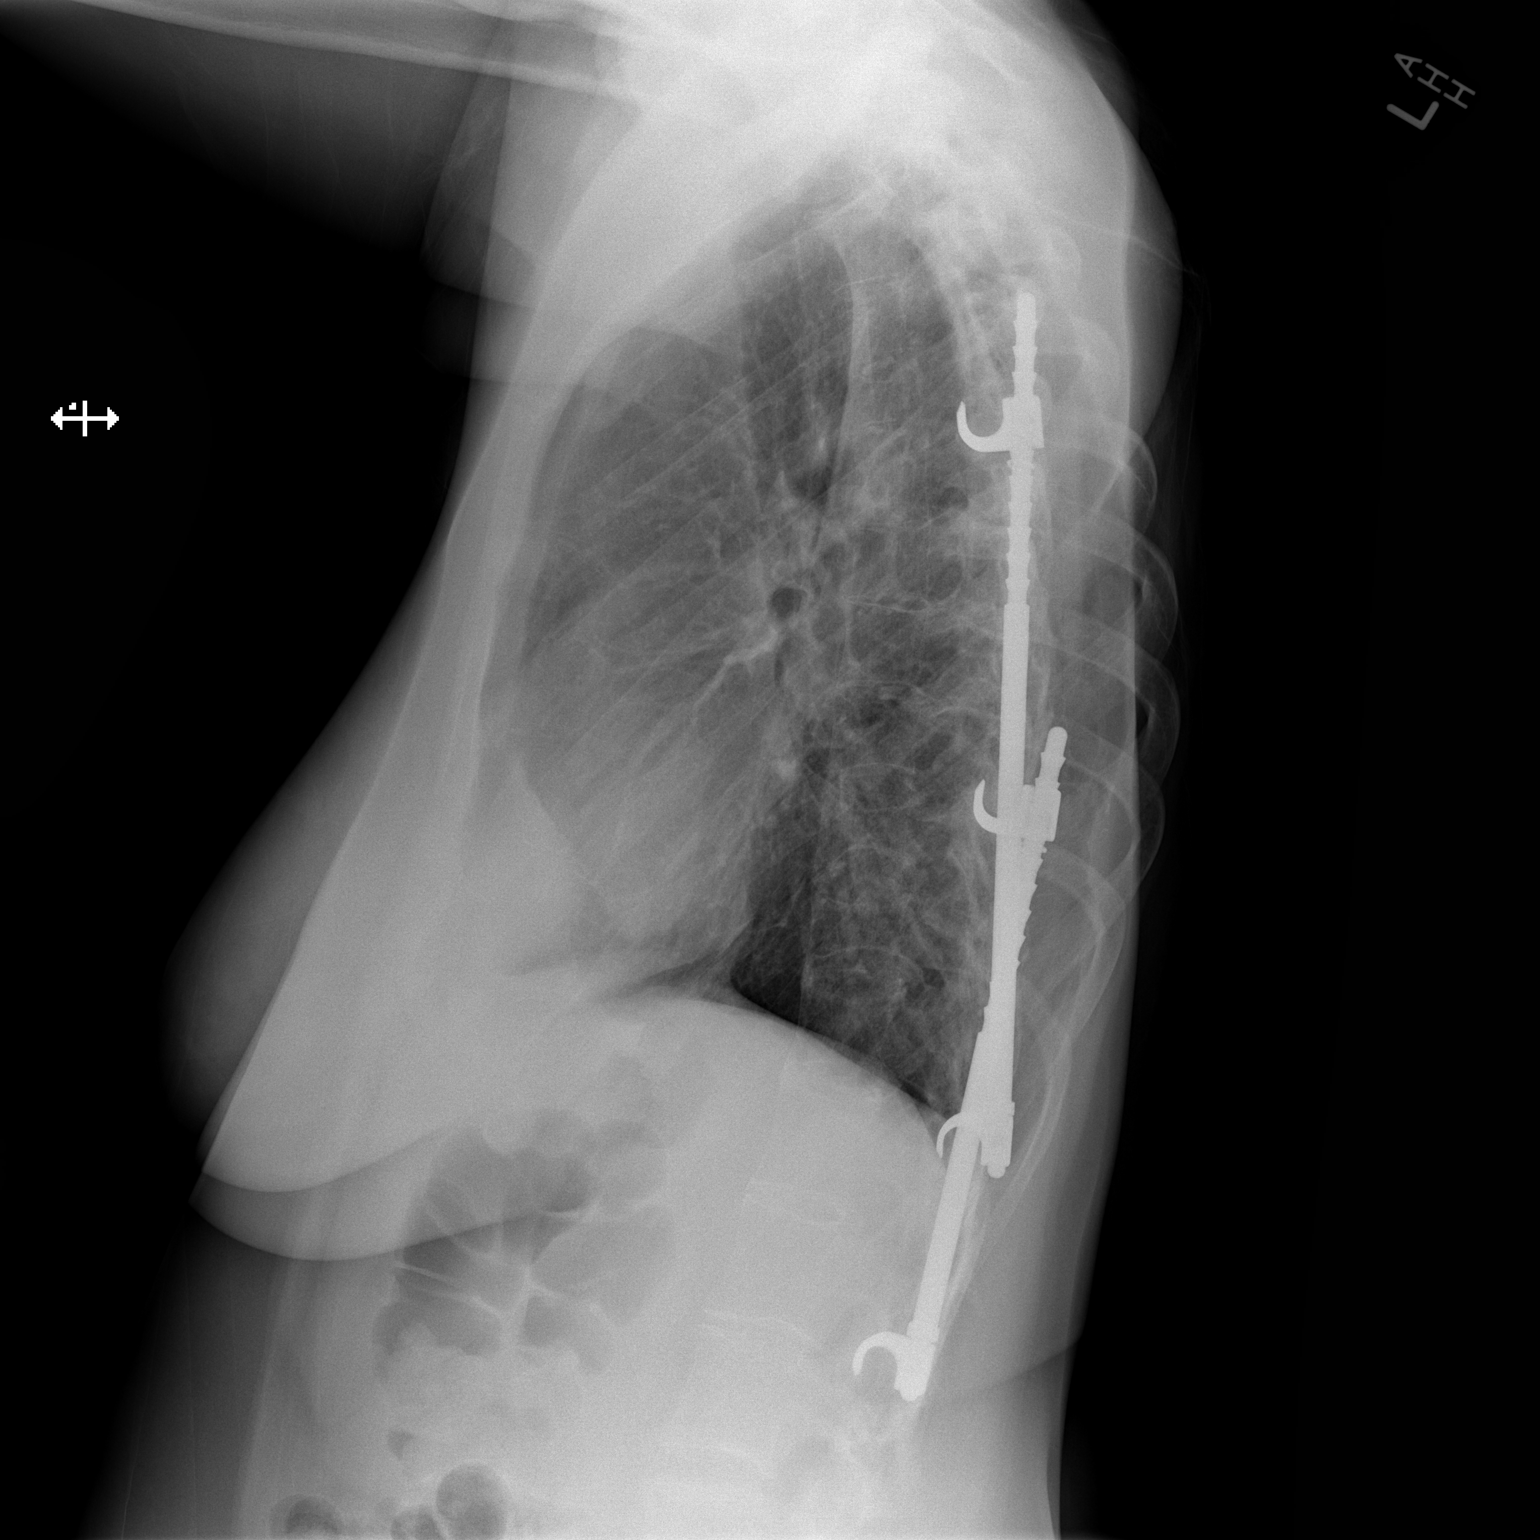

[2 of 2 positions shown; findings below may reference images not displayed]

FINDINGS: Postoperative changes from prior scoliosis surgery with posterior
spinal rods in place. Linear densities in the right lateral lung
base, likely scarring. Left lung is clear. Heart is normal size. No
effusions. No acute bony abnormality.
IMPRESSION: No active cardiopulmonary disease.

## 2016-02-26 IMAGING — CR DG CHEST 1V
1 series · 1 of 1 positions shown · non-contrast
Comparison: CT of the chest performed 04/02/2014, and chest
radiograph performed 03/05/2014

CLINICAL DATA: Acute onset of chills, shaking and fever. Right
shoulder pain, status post recent shoulder surgery. Initial
encounter.

EXAM:
CHEST  1 VIEW

[x chest ap]
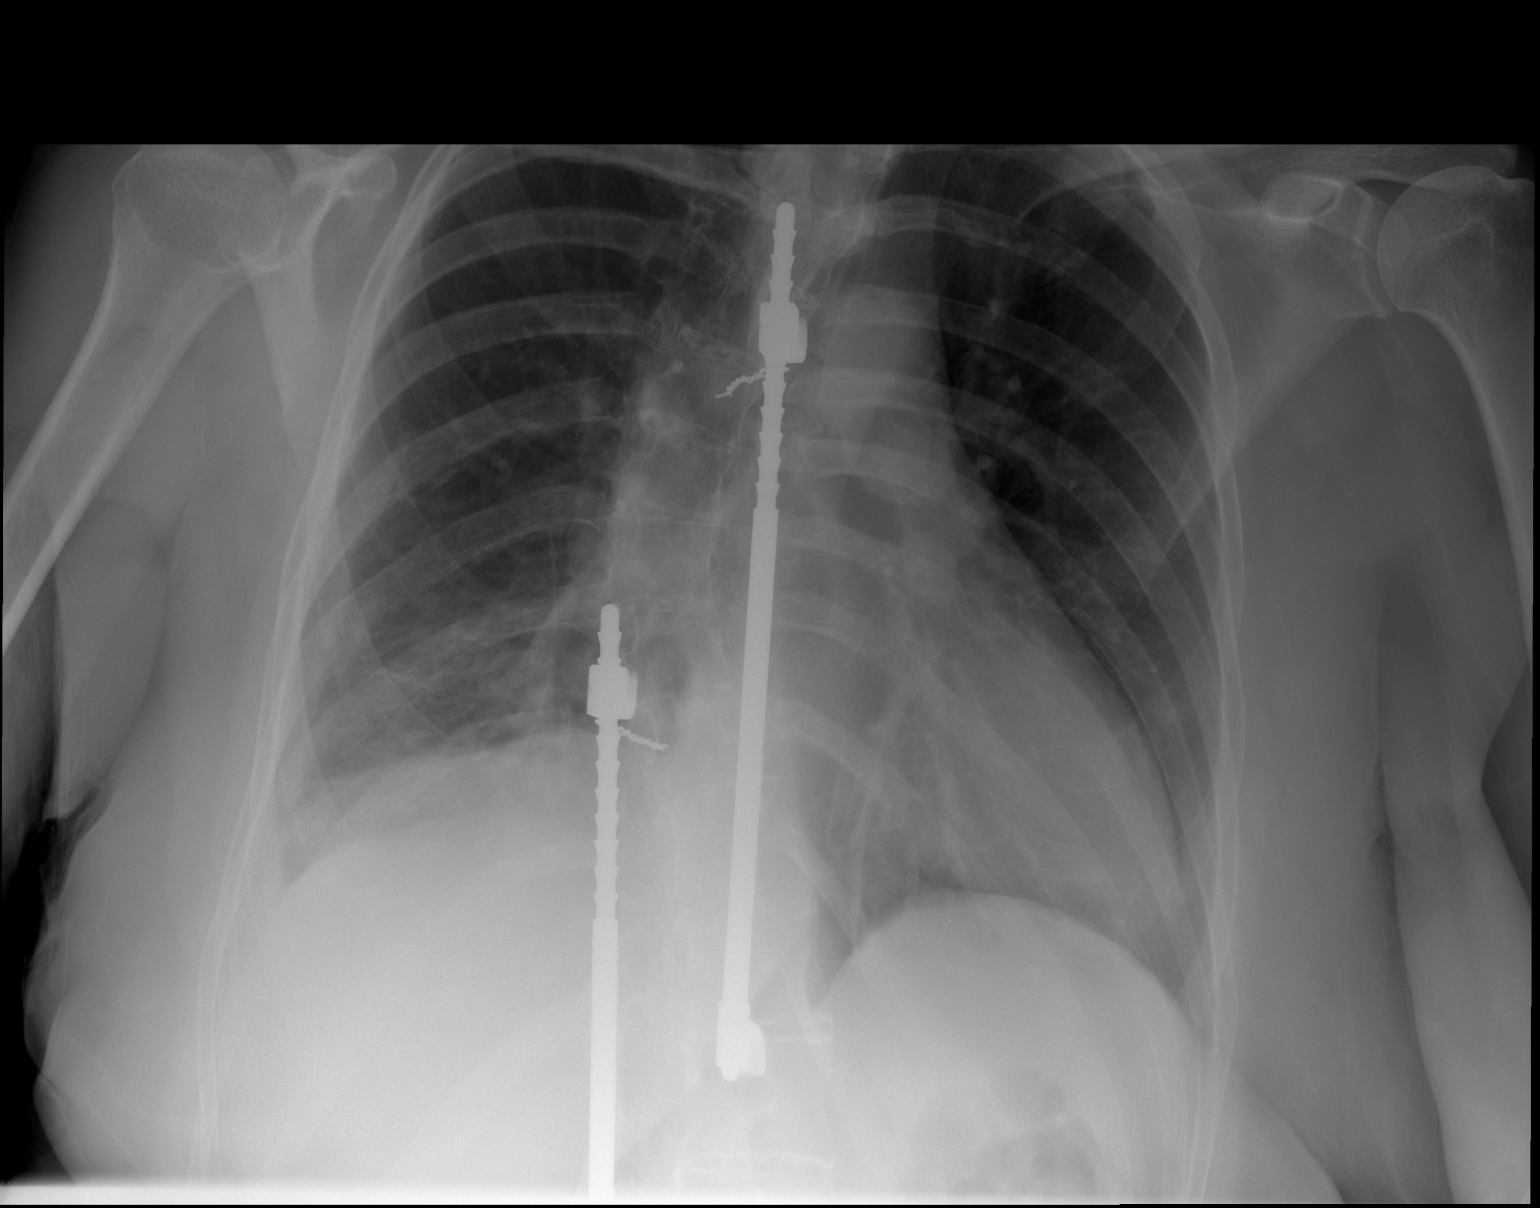

[1 of 1 positions shown; findings below may reference images not displayed]

FINDINGS: The lungs are well-aerated. Mild bibasilar opacities likely reflect
atelectasis or scarring, some of which is present on the prior CT. A
small right pleural effusion is suggested, with adjacent pleural
thickening as previously noted, thought to be posttraumatic or
postinfectious in nature. There is no evidence of pneumothorax.

The cardiomediastinal silhouette is within normal limits. No acute
osseous abnormalities are seen. Thoracic and thoracolumbar spinal
fusion rods are partially imaged.
IMPRESSION: Mild bibasilar atelectasis or scarring noted. Small right pleural
effusions suggested. Adjacent pleural thickening again noted,
thought to be posttraumatic or postinfectious in nature. No
superimposed airspace consolidation seen.

If the patient's symptoms persist, follow-up CT could be considered
on an elective nonemergent basis to ensure that the pleural
thickening remains stable.

## 2016-12-20 IMAGING — CR DG FOOT COMPLETE 3+V*L*
3 series · 3 of 3 positions shown · non-contrast
Comparison: None.

CLINICAL DATA: Left foot pain starting 2 months ago. Remote injury.

EXAM:
LEFT FOOT - COMPLETE 3+ VIEW

[x foot ap left]
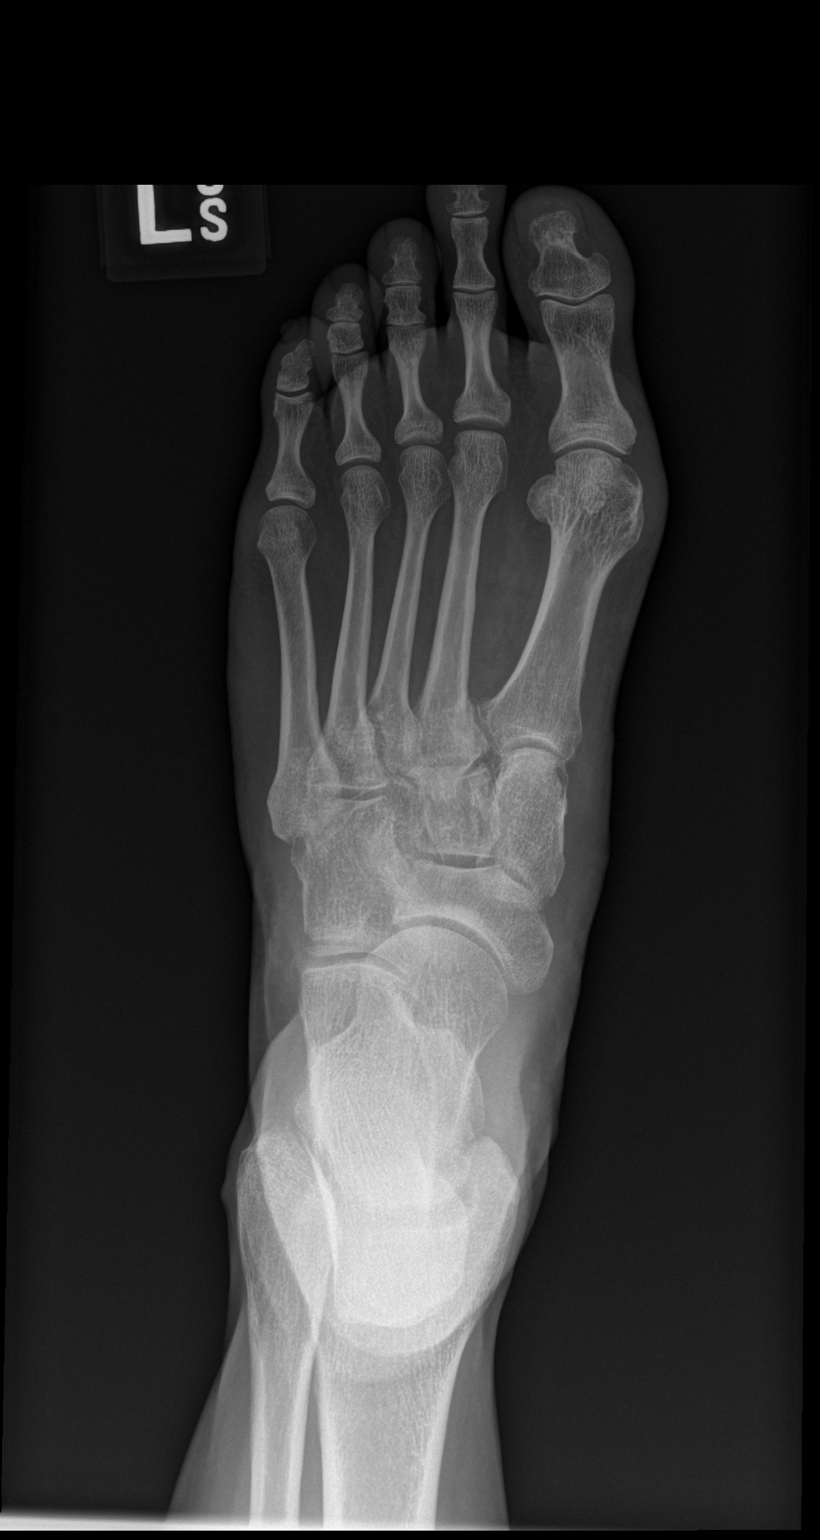

[x foot obl left]
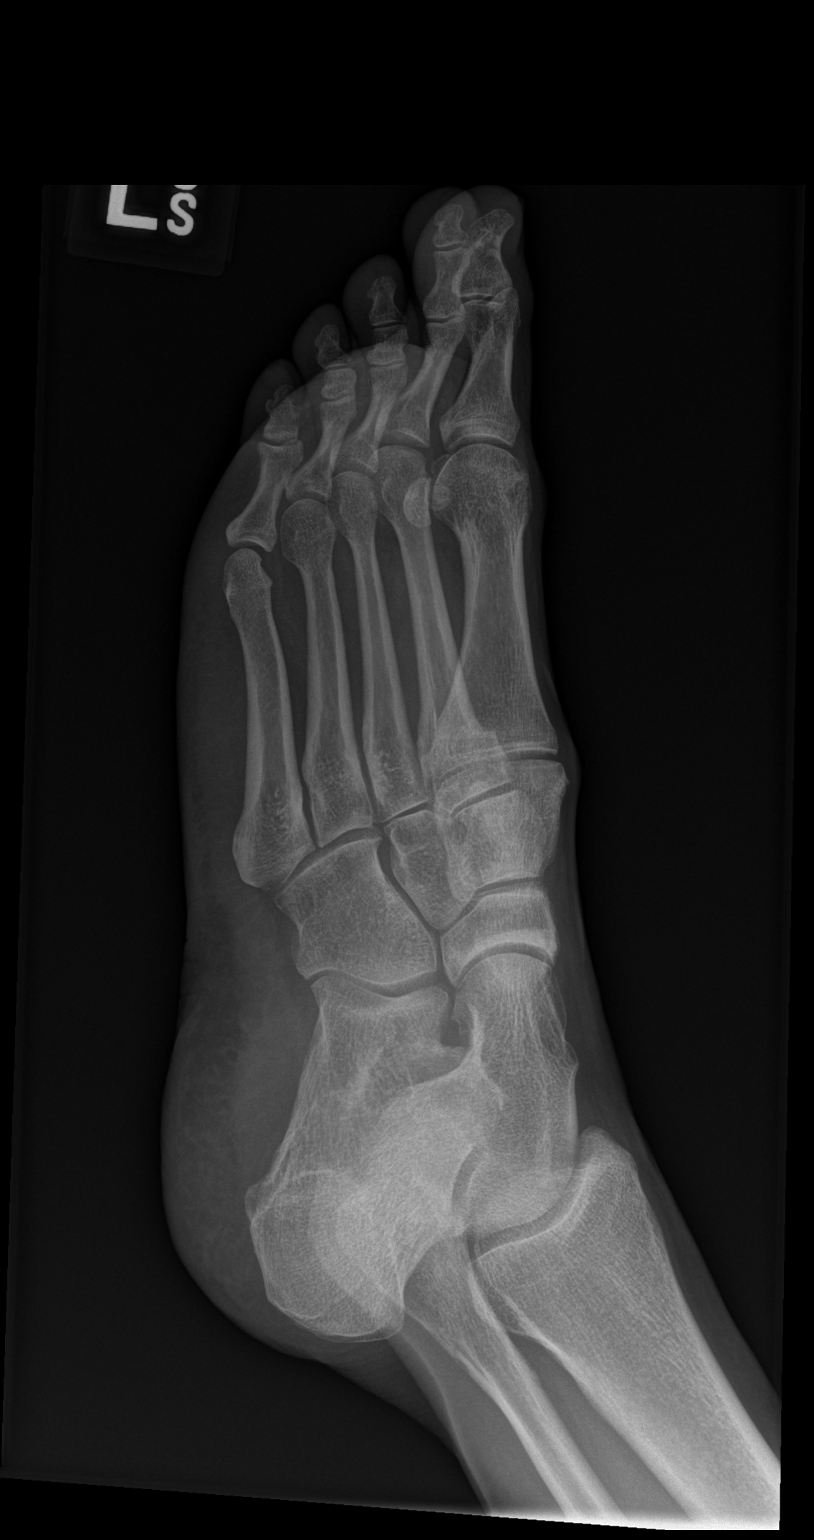

[x foot lat left]
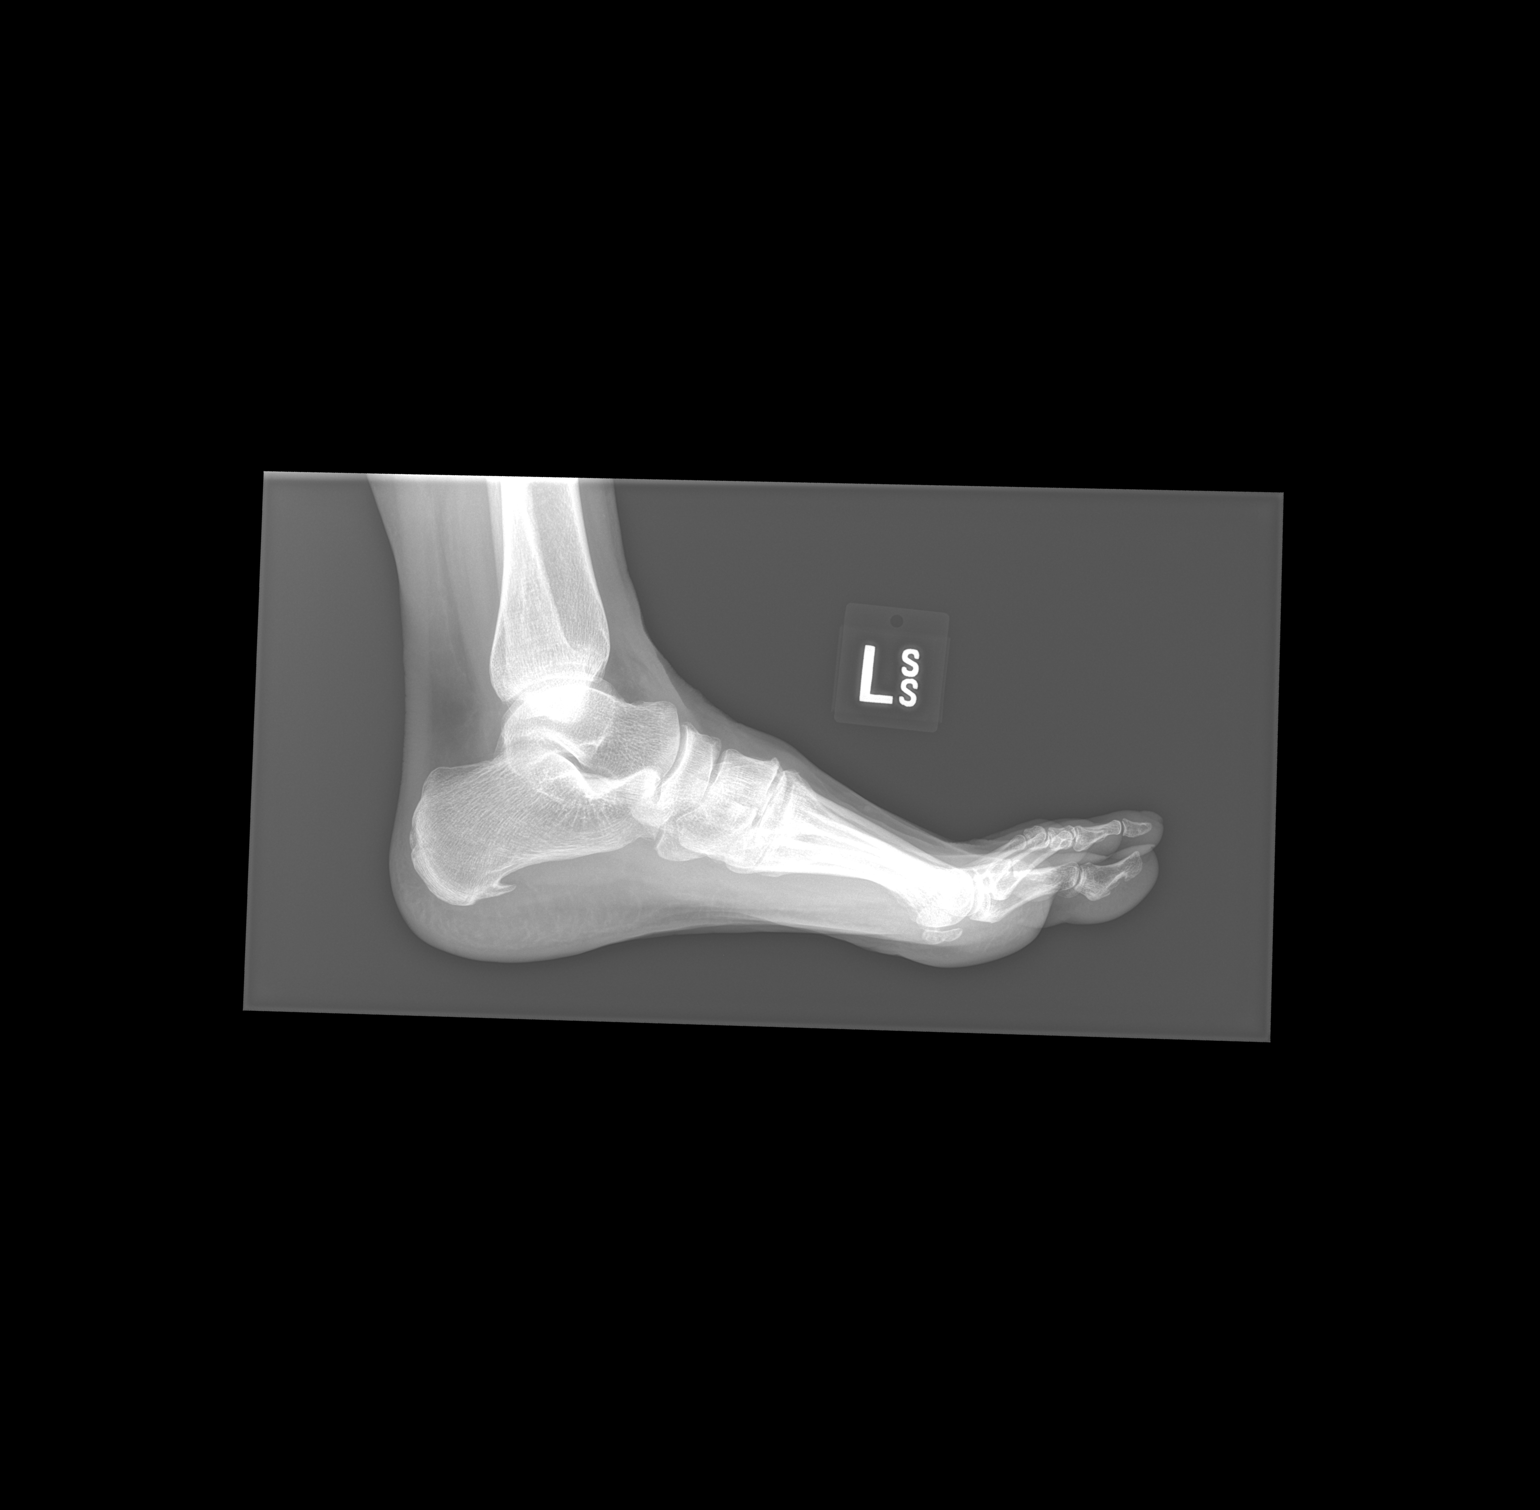

[3 of 3 positions shown; findings below may reference images not displayed]

FINDINGS: Mild spurring of the first metatarsal head. Normal Lisfranc joint
alignment. Plantar calcaneal spur. Mild spurring at the Lisfranc
joint and at the proximal intermetatarsal articulations between the
first and second metatarsals.
IMPRESSION: 1. Mild degenerative spurring of the first metatarsal head and mild
spurring at the Lisfranc joint, but no acute findings.
2. Plantar calcaneal spur.
3. If pain persists despite conservative therapy, MRI may be
warranted for further characterization.

## 2019-10-08 ENCOUNTER — Institutional Professional Consult (permissible substitution): Payer: Self-pay | Admitting: Pulmonary Disease

## 2019-10-18 ENCOUNTER — Other Ambulatory Visit: Payer: Self-pay | Admitting: Radiology

## 2019-10-18 ENCOUNTER — Other Ambulatory Visit: Payer: Self-pay

## 2019-10-18 ENCOUNTER — Other Ambulatory Visit: Payer: Medicare Other

## 2019-10-18 DIAGNOSIS — Z20822 Contact with and (suspected) exposure to covid-19: Secondary | ICD-10-CM

## 2019-10-19 LAB — SARS-COV-2, NAA 2 DAY TAT

## 2019-10-19 LAB — NOVEL CORONAVIRUS, NAA: SARS-CoV-2, NAA: NOT DETECTED

## 2019-10-22 ENCOUNTER — Ambulatory Visit (INDEPENDENT_AMBULATORY_CARE_PROVIDER_SITE_OTHER): Payer: Medicare Other | Admitting: Pulmonary Disease

## 2019-10-22 ENCOUNTER — Encounter: Payer: Self-pay | Admitting: Pulmonary Disease

## 2019-10-22 ENCOUNTER — Other Ambulatory Visit: Payer: Self-pay

## 2019-10-22 VITALS — BP 118/74 | HR 78 | Temp 97.1°F | Ht 63.0 in | Wt 154.1 lb

## 2019-10-22 DIAGNOSIS — Z5329 Procedure and treatment not carried out because of patient's decision for other reasons: Secondary | ICD-10-CM

## 2019-10-22 DIAGNOSIS — R059 Cough, unspecified: Secondary | ICD-10-CM

## 2019-10-22 DIAGNOSIS — R05 Cough: Secondary | ICD-10-CM

## 2019-10-22 DIAGNOSIS — M329 Systemic lupus erythematosus, unspecified: Secondary | ICD-10-CM | POA: Insufficient documentation

## 2019-10-22 DIAGNOSIS — J4530 Mild persistent asthma, uncomplicated: Secondary | ICD-10-CM

## 2019-10-22 DIAGNOSIS — F32A Depression, unspecified: Secondary | ICD-10-CM | POA: Insufficient documentation

## 2019-10-22 DIAGNOSIS — M461 Sacroiliitis, not elsewhere classified: Secondary | ICD-10-CM | POA: Insufficient documentation

## 2019-10-22 DIAGNOSIS — I8390 Asymptomatic varicose veins of unspecified lower extremity: Secondary | ICD-10-CM | POA: Insufficient documentation

## 2019-10-22 DIAGNOSIS — M419 Scoliosis, unspecified: Secondary | ICD-10-CM | POA: Insufficient documentation

## 2019-10-22 DIAGNOSIS — F329 Major depressive disorder, single episode, unspecified: Secondary | ICD-10-CM | POA: Insufficient documentation

## 2019-10-22 MED ORDER — LEVALBUTEROL TARTRATE 45 MCG/ACT IN AERO: 2.0000 | INHALATION_SPRAY | RESPIRATORY_TRACT | 12 refills | Status: AC | PRN

## 2019-10-22 NOTE — Patient Instructions (Signed)
We will send in prescription for Levalbuterol as needed for cough, wheezing or shortness of breath We have ordered PFTs We have updated a CT Chest scan to evaluate the cough

## 2019-10-22 NOTE — Progress Notes (Signed)
Patient ID: Jeanette Carpenter, female    DOB: 07-Dec-1960, 59 y.o.   MRN: [id number]  Chief Complaint  Patient presents with  . Consult    self referral- former pt, moved to [facility], moved back 04/2018.  tx of R lung partial collapse post spinal fusion sx.      Referring provider: Leilani Able, MD  HPI: Jeanette Carpenter is a 59 year old woman with history of asthma, scoliosis, cutaneous lupus and hypothyroidism who presents to pulmonary clinic to establish care for her asthma.   Her asthma is well controlled on advair 250-21mcg 1 puff twice daily. She does not have a rescue albuterol inhaler as this causes her to feel jittery with heart racing. She takes dulera as needed but has not required this in some time. She denies night time awakenings with cough, wheezing or shortness of breath. She does report coughing attacks when she is laughing. She recently moved from [place], [place] where she was being evaluated at [facility] pulmonary department. She also has history of pleural thickening with scarring in the right lower lobe since she had her scoliosis repaired in 1979 where she had collapsed lung and possible hemothorax requiring chest tube placement.   She denies exertional shortness of breath or limitations to her daily activities.   No PFTs on file.     CT Chest 04/02/2014 reviewed, severe thoracolumbar scoliosis with Harrington rods in place. No mediastinal or hilar mass or adenopathy noted. The lung parenchyma is unremarkable along with the airways. Right sided pleural thickening noted along with pleural calcifications and basilar scarring. No pleural effusions.    Allergies  Allergen Reactions  . Lyrica [Pregabalin]     Suicidal thoughts  . Oxycodone-Acetaminophen Itching  . Tramadol Itching  . Dilaudid [Hydromorphone Hcl] Rash    Immunization History  Administered Date(s) Administered  . Influenza Split 12/22/2018    Past Medical History:  Diagnosis Date  . Arthritis   .  Asthma   . Discoid lupus   . History of blood transfusion   . Lung collapse    right  . Pneumonia 11/15  . Rotator cuff tear    right  . Scoliosis   . Shortness of breath dyspnea    partial collapse lung on right- nothing new at this time    Tobacco History: Social History   Tobacco Use  Smoking Status Never Smoker  Smokeless Tobacco Never Used   Counseling given: Not Answered   Outpatient Medications Prior to Visit  Medication Sig Dispense Refill  . amitriptyline (ELAVIL) 25 MG tablet Take 25 mg by mouth at bedtime.    . Fluticasone-Salmeterol (ADVAIR) 250-50 MCG/DOSE AEPB Inhale 1 puff into the lungs 2 (two) times daily.    Marland Kitchen gabapentin (NEURONTIN) 300 MG capsule Take 300 mg by mouth at bedtime.    . montelukast (SINGULAIR) 10 MG tablet Take 10 mg by mouth at bedtime.    Marland Kitchen albuterol (PROVENTIL) (2.5 MG/3ML) 0.083% nebulizer solution Take 2.5 mg by nebulization every 6 (six) hours as needed for wheezing or shortness of breath.    . ALPRAZolam (XANAX) 0.5 MG tablet Take 1 tablet (0.5 mg total) by mouth 3 (three) times daily as needed for anxiety. (Patient not taking: Reported on 03/16/2015) 15 tablet 0  . cephALEXin (KEFLEX) 500 MG capsule Take 1 capsule (500 mg total) by mouth 2 (two) times daily. 20 capsule 0  . HYDROmorphone (DILAUDID) 2 MG tablet Take 1 tablet (2 mg total) by mouth every 3 (three)  hours as needed for severe pain (Q4-6 hours PRN). (Patient not taking: Reported on 03/16/2015) 60 tablet 0  . ipratropium (ATROVENT HFA) 17 MCG/ACT inhaler Inhale 2 puffs into the lungs every 4 (four) hours as needed for wheezing. Reported on 03/16/2015    . levalbuterol (XOPENEX HFA) 45 MCG/ACT inhaler Inhale 2 puffs into the lungs every 6 (six) hours as needed for wheezing.    . meloxicam (MOBIC) 7.5 MG tablet Take 7.5 mg by mouth daily.    . methocarbamol (ROBAXIN) 500 MG tablet Take 1 tablet (500 mg total) by mouth every 6 (six) hours as needed for muscle spasms. (Patient not taking:  Reported on 03/16/2015) 40 tablet 1  . naproxen sodium (ANAPROX) 220 MG tablet Take 440 mg by mouth 2 (two) times daily as needed (Pain).    Marland Kitchen oxyCODONE-acetaminophen (PERCOCET) 5-325 MG per tablet Take 1 tablet by mouth every 6 (six) hours as needed for moderate pain. (Patient not taking: Reported on 03/16/2015) 20 tablet 0  . polyvinyl alcohol (LIQUIFILM TEARS) 1.4 % ophthalmic solution Place 1 drop into both eyes 3 (three) times daily as needed for dry eyes.     No facility-administered medications prior to visit.     Review of Systems:   Constitutional:   No  weight loss, night sweats,  Fevers, chills, fatigue, or  lassitude.  HEENT:   No headaches,  Difficulty swallowing,  Tooth/dental problems, or  Sore throat,                No sneezing, itching, ear ache, nasal congestion, post nasal drip,   CV:  No chest pain,  Orthopnea, PND, swelling in lower extremities, anasarca, dizziness, palpitations, syncope.   GI  No heartburn, indigestion, abdominal pain, nausea, vomiting, diarrhea, change in bowel habits, loss of appetite, bloody stools.   Resp: No shortness of breath with exertion or at rest.  No excess mucus, no productive cough,  No non-productive cough,  No coughing up of blood.  No change in color of mucus.  No wheezing.  No chest wall deformity  Skin: no rash or lesions.  GU: no dysuria, change in color of urine, no urgency or frequency.  No flank pain, no hematuria   MS:  No joint pain or swelling.  No decreased range of motion.  No back pain.    Physical Exam  BP 118/74 (BP Location: Left Arm, Cuff Size: Normal)   Pulse 78   Temp (!) 97.1 F (36.2 C) (Temporal)   Ht 5\' 3"  (1.6 m)   Wt 154 lb 1.6 oz (69.9 kg)   LMP 02/12/2014   SpO2 98%   BMI 27.30 kg/m   GEN: A/Ox3; pleasant , NAD, well nourished    HEENT:  [place]/AT,  EACs-clear, TMs-wnl, NOSE-clear, THROAT-clear, no lesions, no postnasal drip or exudate noted.   NECK:  Supple w/ fair ROM; no JVD; normal carotid  impulses w/o bruits; no thyromegaly or nodules palpated; no lymphadenopathy.    RESP  Clear  P & A; w/o, wheezes/ rales/ or rhonchi. no accessory muscle use, no dullness to percussion  CARD:  RRR, no m/r/g, no peripheral edema, pulses intact, no cyanosis or clubbing.  GI:   Soft & nt; nml bowel sounds; no organomegaly or masses detected.   Musco: Warm bil, no deformities or joint swelling noted.   Neuro: alert, no focal deficits noted.    Skin: Warm, no lesions or rashes    Lab Results:  CBC    Component Value  Date/Time   WBC 11.1 (H) 05/22/2014 1856   RBC 4.53 05/22/2014 1856   HGB 13.1 05/22/2014 1856   HCT 41.0 05/22/2014 1856   PLT 222 05/22/2014 1856   MCV 90.5 05/22/2014 1856   MCH 28.9 05/22/2014 1856   MCHC 32.0 05/22/2014 1856   RDW 13.7 05/22/2014 1856   LYMPHSABS 1.6 05/14/2014 1210   MONOABS 0.2 05/14/2014 1210   EOSABS 0.1 05/14/2014 1210   BASOSABS 0.0 05/14/2014 1210    BMET    Component Value Date/Time   NA 139 05/22/2014 1856   K 3.9 05/22/2014 1856   CL 104 05/22/2014 1856   CO2 28 05/22/2014 1856   GLUCOSE 113 (H) 05/22/2014 1856   BUN 13 05/22/2014 1856   CREATININE 0.82 05/22/2014 1856   CALCIUM 9.1 05/22/2014 1856   GFRNONAA 81 (L) 05/22/2014 1856   GFRAA >90 05/22/2014 1856      Assessment & Plan:  Jeanette Carpenter is a 59 year old woman with history of asthma, scoliosis, cutaneous lupus and hypothyroidism who presents to pulmonary clinic to establish care for her asthma.   Her asthma is well controlled at this time. She is to continue advair 250-1mcg 1 puff twice daily and we will provide her with a levalbuterol rescue inhaler prescription to be used as needed. We will obtain updated PFTs.   For her cough with laughter, this may be related to her asthma but it overall appears to be well controlled. We will obtain a chest CT for the cough. She may have a degree of excessive dynamic airway collapse or tracheobronchomalacia based on her  symptoms. We will consider further evaluation in the future with bronchoscopy or inspiratory and expiratory phase chest CT.  She is to follow up in 3 months.  Martina Sinner, MD 10/22/2019

## 2019-10-26 ENCOUNTER — Inpatient Hospital Stay (HOSPITAL_COMMUNITY): Admission: RE | Admit: 2019-10-26 | Payer: Medicare Other | Source: Ambulatory Visit

## 2019-10-28 ENCOUNTER — Encounter: Payer: Self-pay | Admitting: Pulmonary Disease

## 2019-10-28 ENCOUNTER — Telehealth: Payer: Self-pay | Admitting: Pulmonary Disease

## 2019-10-28 NOTE — Telephone Encounter (Signed)
Called pt to give her the phone # to Central Scheduling so she can reschedule.  Had to leave a vm & asked pt to call me back.

## 2019-10-29 NOTE — Telephone Encounter (Signed)
Pt scheduled 10/30/19@[facility]  CT Tobe Sos

## 2019-10-30 ENCOUNTER — Ambulatory Visit (HOSPITAL_COMMUNITY): Payer: Medicare Other | Attending: Pulmonary Disease

## 2019-11-04 ENCOUNTER — Telehealth: Payer: Self-pay | Admitting: Pulmonary Disease

## 2019-11-04 DIAGNOSIS — J4530 Mild persistent asthma, uncomplicated: Secondary | ICD-10-CM

## 2019-11-04 NOTE — Telephone Encounter (Signed)
Called pt but unable to reach. Left message for pt to return call. 

## 2019-11-04 NOTE — Telephone Encounter (Signed)
Pt is also wanting her Advair called to [facility] pharmacy [phone]

## 2019-11-05 MED ORDER — LEVALBUTEROL HCL 0.63 MG/3ML IN NEBU: 0.6300 mg | INHALATION_SOLUTION | RESPIRATORY_TRACT | 3 refills | Status: DC | PRN

## 2019-11-05 NOTE — Telephone Encounter (Signed)
Attempted to call pt but unable to reach. Left message for her to return call. 

## 2019-11-05 NOTE — Telephone Encounter (Signed)
Pt returning a phone call. Pt can be reached at [phone].

## 2019-11-05 NOTE — Telephone Encounter (Signed)
Called and spoke with pt who is requesting to have a nebulizer machine and medications sent in for her.  Pt said at night at times she does have some problems breathing. Pt said she used to have a nebulizer and when she had the problems with her breathing, she would do a neb tx which would help. Pt stated that she is wheezing and stated she wheezes almost all the time as she only has 1/2 lung on rt side.  Dr. Francine Graven, please advise if you are okay with [place] sending a prescription for nebulizer machine and neb meds to DME for pt and if so, please advise what neb meds?

## 2019-11-05 NOTE — Telephone Encounter (Signed)
Order for nebulizer sent to DME. Order for levalbuterol neb solution sent to local pharmacy. lmtcb X1 for pt to make aware

## 2019-11-05 NOTE — Telephone Encounter (Signed)
Pt returning a phone call. Pt can be reached at [phone]. Pt would like a call back after 11 am.

## 2019-11-05 NOTE — Telephone Encounter (Signed)
Yes, that is ok to send in a nebulizer machine and prescription for levalbuterol solution. I recently had a PA approved for levalubterol inhaler for PRN use. Thanks.

## 2019-11-05 NOTE — Telephone Encounter (Signed)
Pt calling back - [phone]

## 2019-11-06 ENCOUNTER — Telehealth: Payer: Self-pay | Admitting: Pulmonary Disease

## 2019-11-06 NOTE — Telephone Encounter (Signed)
Spoke with patient letting her know that nebulizer machine and medication have been sent in and she should get them soon. She expressed understanding. Nothing further needed at this time.

## 2019-11-06 NOTE — Telephone Encounter (Signed)
Patient's insurance company does not cover Advair 250-50 MCG/ACT inhaler.I have the alternative medication's her insurance does cover.Can you please advise.  Advair HFA 45-21,115-21,230-21 Advair Diskus 100-50,500-50 Breo 100-25 Anoro 200-25 trelegy 100-62.5-25 Breztri 160-9-4.8 Trelegy 200-62-5-25 Combivent Resp 100

## 2019-11-07 MED ORDER — FLUTICASONE-SALMETEROL 250-50 MCG/DOSE IN AEPB: 1.0000 | INHALATION_SPRAY | Freq: Two times a day (BID) | RESPIRATORY_TRACT | 5 refills | Status: AC

## 2019-11-07 NOTE — Telephone Encounter (Signed)
Does she qualify for advair diskus 51mcg/250mcg? If so, this can be sent in for her. If not, then we can prescribe Breo 100-25.    Thanks, Cletis Athens

## 2019-11-07 NOTE — Telephone Encounter (Signed)
Called pharmacy to see if rx can be filled, because it doesn't make sense that 2 of the 3 strengths of a med would be covered by insurance.  Per pharmacist, any Advair is not preferred and requires a PA.  No covered alternatives were listed.  Pharmacy is faxing PA request (pt has medicare) to our office.  Will await fax.

## 2019-11-12 ENCOUNTER — Other Ambulatory Visit (HOSPITAL_COMMUNITY)
Admission: RE | Admit: 2019-11-12 | Discharge: 2019-11-12 | Disposition: A | Discharge status: Home / Self Care | Payer: Medicare Other | Source: Ambulatory Visit | Attending: Pulmonary Disease | Admitting: Pulmonary Disease

## 2019-11-12 DIAGNOSIS — Z01812 Encounter for preprocedural laboratory examination: Secondary | ICD-10-CM | POA: Insufficient documentation

## 2019-11-12 DIAGNOSIS — Z20822 Contact with and (suspected) exposure to covid-19: Secondary | ICD-10-CM | POA: Insufficient documentation

## 2019-11-12 LAB — SARS CORONAVIRUS 2 (TAT 6-24 HRS): SARS Coronavirus 2: NEGATIVE

## 2019-11-12 NOTE — Telephone Encounter (Signed)
Triage, please advise if this was received. Thanks.  °

## 2019-11-12 NOTE — Telephone Encounter (Signed)
Fax received stating that PA was approved through 03/06/20.  Called pt's pharmacy and spoke with Dahlia Client letting her know that the PA for pt's med was approved and she was able to run it through. Pt will have a $4 copay.  Attempted to call pt to let her know this info but unable to reach. Left pt a detailed message letting her know that the PA had been approved and that she should be able to get med from pharmacy. Nothing further needed.

## 2019-11-12 NOTE — Telephone Encounter (Signed)
PA request has been received. Called and spoke with pt making her aware that we were doing PA for Advair and that we would call her once we had an update and she verbalized understanding.  Medication name and strength: Fluticasone-Salmeterol (Advair) 250-27mcg Provider: Francine Graven Pharmacy: [facility] Patient insurance ID: [id number] Phone: [phone] Fax: [phone]  Was the PA started on CMM?  yes If yes, please enter the Key: [id number] Timeframe for approval/denial: message stated after PA was initiated to check back on dashboard for status update.  Will keep encounter open to recheck PA later for update. Will document once PA response has been determined.

## 2019-11-14 ENCOUNTER — Other Ambulatory Visit: Payer: Self-pay

## 2019-11-14 ENCOUNTER — Ambulatory Visit (INDEPENDENT_AMBULATORY_CARE_PROVIDER_SITE_OTHER): Payer: Medicare Other | Admitting: Pulmonary Disease

## 2019-11-14 DIAGNOSIS — J4530 Mild persistent asthma, uncomplicated: Secondary | ICD-10-CM

## 2019-11-14 LAB — PULMONARY FUNCTION TEST
DL/VA % pred: 132 %
DL/VA: 5.61 ml/min/mmHg/L
DLCO cor % pred: 77 %
DLCO cor: 15.6 ml/min/mmHg
DLCO unc % pred: 77 %
DLCO unc: 15.6 ml/min/mmHg
FEF 25-75 Post: 2.46 L/sec
FEF 25-75 Pre: 2.02 L/sec
FEF2575-%Change-Post: 22 %
FEF2575-%Pred-Post: 102 %
FEF2575-%Pred-Pre: 83 %
FEV1-%Change-Post: 5 %
FEV1-%Pred-Post: 60 %
FEV1-%Pred-Pre: 57 %
FEV1-Post: 1.55 L
FEV1-Pre: 1.46 L
FEV1FVC-%Change-Post: 0 %
FEV1FVC-%Pred-Pre: 110 %
FEV6-%Change-Post: 6 %
FEV6-%Pred-Post: 56 %
FEV6-%Pred-Pre: 52 %
FEV6-Post: 1.79 L
FEV6-Pre: 1.68 L
FEV6FVC-%Pred-Post: 103 %
FEV6FVC-%Pred-Pre: 103 %
FVC-%Change-Post: 6 %
FVC-%Pred-Post: 54 %
FVC-%Pred-Pre: 50 %
FVC-Post: 1.79 L
FVC-Pre: 1.68 L
Post FEV1/FVC ratio: 86 %
Post FEV6/FVC ratio: 100 %
Pre FEV1/FVC ratio: 87 %
Pre FEV6/FVC Ratio: 100 %
RV % pred: 64 %
RV: 1.24 L
TLC % pred: 69 %
TLC: 3.46 L

## 2019-11-14 NOTE — Progress Notes (Signed)
Full PFT performed today. °

## 2019-11-15 ENCOUNTER — Telehealth: Payer: Self-pay | Admitting: *Deleted

## 2019-11-15 NOTE — Telephone Encounter (Signed)
Levalbuterol Tartrate is not covered by her insurance---they will cover the Ventolin HFA if that is something that you want to change her to.  Thanks

## 2019-11-15 NOTE — Telephone Encounter (Signed)
I got a prior authorization approved by her insurance company over the phone a couple weeks ago and they said they would be sending a letter to the patient in the mail. It should be covered by her insurance. If she is having issues, then she should call her insurance company or the pharmacy to check.

## 2019-12-16 ENCOUNTER — Telehealth: Payer: Self-pay | Admitting: Pulmonary Disease

## 2019-12-16 MED ORDER — LEVALBUTEROL HCL 0.63 MG/3ML IN NEBU: 0.6300 mg | INHALATION_SOLUTION | RESPIRATORY_TRACT | 3 refills | Status: AC | PRN

## 2019-12-16 NOTE — Telephone Encounter (Signed)
Called and spoke with patient, she verified she has not had the levalbuterol filled since it was sent to [facility] on 11/05/19, advised we would send to [facility] pharmacy as requested. Called [facility] to request to have the prescription transferred, advised it would be quicker to just resend the script.  As I was resending the script, I received a notification that it requires a PA, but the alternatives do as well.  PA started.  Covered:  Albuterol 2.5mg /ml and Ipratropium-Albuterol.  Will route to Dr. Francine Graven to see if he wants to change the nebulizer medication.  Patient called and made aware.  Dr. Francine Graven, Levalbuterol is not covered and requires a PA, would you like to order medication that is covered?  Albuterol 2.5mg /ml or Ipratropium-Albuterol are both covered, please advise.  Thank you.

## 2019-12-17 NOTE — Telephone Encounter (Signed)
I believe there should be a PA already processed and approved by the insurance company. I spoke with a clinical pharmacist to get it approved over the phone. Please check with the insurance company. The patient has side effects from albuterol previously reported that is why she has requested levalbuterol.  If the patient is ok with albuterol products, then I am fine with sending in a script for albuterol nebs.

## 2019-12-19 MED ORDER — ALBUTEROL SULFATE (2.5 MG/3ML) 0.083% IN NEBU: 2.5000 mg | INHALATION_SOLUTION | RESPIRATORY_TRACT | 12 refills | Status: AC | PRN

## 2019-12-19 NOTE — Telephone Encounter (Signed)
Lmtcb for pt to see if she can tolerate albuterol.

## 2019-12-19 NOTE — Telephone Encounter (Signed)
Called and spoke with pt letting her know that Dr. Francine Graven was okay with [place] sending albuterol neb sol to pharmacy if she could tolerate it and pt stated she was fine with that being done. Rx for albuterol neb sol has been sent to pharmacy for pt.nothing further needed.

## 2019-12-19 NOTE — Telephone Encounter (Signed)
Pt returning call - [phone]

## 2020-07-28 ENCOUNTER — Telehealth: Payer: Self-pay | Admitting: Pulmonary Disease

## 2020-07-28 NOTE — Telephone Encounter (Signed)
I have called and spoke with the pt and she is aware that [facility] keeps sending her brovanna and pulmicort.  I have reviewed her chart and I do not even see these medication on her med list.  Not even listed as a past medication that she has used before.  She stated that she has these boxes in her closet that have not been opened.  I advised her that I would call the pharmacy and let them know that she will be sending all of this back to them and that they should cancel that order.  She is going to do this and will call back for any issues.
